# Patient Record
Sex: Male | Born: 1965 | Race: White | Hispanic: No | Marital: Married
Health system: Southern US, Community
[De-identification: ages and names within clinical notes are randomized; demographics above are authoritative.]

## PROBLEM LIST (undated history)

## (undated) HISTORY — PX: OTHER SURGICAL HISTORY: SHX169

---

## 1998-07-26 ENCOUNTER — Ambulatory Visit (HOSPITAL_COMMUNITY): Admission: RE | Admit: 1998-07-26 | Discharge: 1998-07-26 | Payer: Self-pay | Admitting: *Deleted

## 1998-10-24 ENCOUNTER — Encounter: Payer: Self-pay | Admitting: Emergency Medicine

## 1998-10-24 ENCOUNTER — Emergency Department (HOSPITAL_COMMUNITY): Admission: EM | Admit: 1998-10-24 | Discharge: 1998-10-24 | Payer: Self-pay | Admitting: Emergency Medicine

## 1998-10-25 ENCOUNTER — Emergency Department (HOSPITAL_COMMUNITY): Admission: EM | Admit: 1998-10-25 | Discharge: 1998-10-25 | Payer: Self-pay

## 1998-10-25 ENCOUNTER — Ambulatory Visit (HOSPITAL_BASED_OUTPATIENT_CLINIC_OR_DEPARTMENT_OTHER): Admission: RE | Admit: 1998-10-25 | Discharge: 1998-10-25 | Payer: Self-pay | Admitting: Orthopedic Surgery

## 2006-05-29 ENCOUNTER — Emergency Department (HOSPITAL_COMMUNITY): Admission: EM | Admit: 2006-05-29 | Discharge: 2006-05-29 | Payer: Self-pay | Admitting: Emergency Medicine

## 2007-03-14 ENCOUNTER — Emergency Department (HOSPITAL_COMMUNITY): Admission: EM | Admit: 2007-03-14 | Discharge: 2007-03-14 | Payer: Self-pay | Admitting: Emergency Medicine

## 2007-06-23 ENCOUNTER — Emergency Department (HOSPITAL_COMMUNITY): Admission: EM | Admit: 2007-06-23 | Discharge: 2007-06-23 | Payer: Self-pay | Admitting: *Deleted

## 2007-08-05 ENCOUNTER — Ambulatory Visit (HOSPITAL_COMMUNITY): Admission: RE | Admit: 2007-08-05 | Discharge: 2007-08-05 | Payer: Self-pay | Admitting: Chiropractic Medicine

## 2008-06-13 ENCOUNTER — Emergency Department (HOSPITAL_COMMUNITY): Admission: EM | Admit: 2008-06-13 | Discharge: 2008-06-13 | Payer: Self-pay | Admitting: Emergency Medicine

## 2008-10-11 IMAGING — CT CT PELVIS W/O CM
1 of 2 series · 15 of 32 positions shown, 19 images · non-contrast
Comparison: None

CLINICAL DATA: Right flank pain

ABDOMEN CT WITHOUT CONTRAST
TECHNIQUE: Multidetector CT imaging of the abdomen was performed following the
standard protocol without IV contrast.
TECHNIQUE: Multidetector CT imaging of the pelvis was performed following the

[Series 2: stone 5.0 b40f · axial · 0.73mm/px · z∈[-468,-28]mm · 15 of 96 slices shown, 19 images]
[im 4/96  soft-tissue]
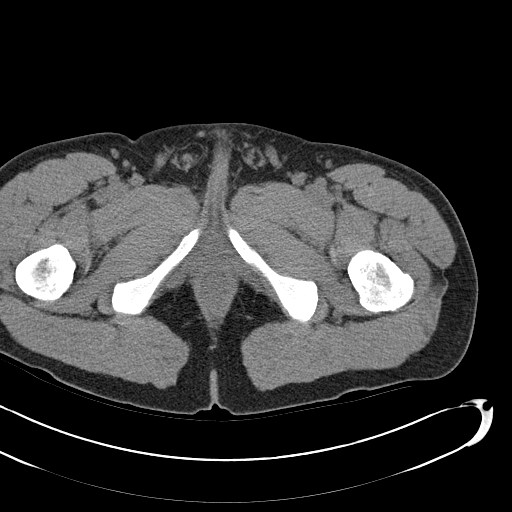
[im 4/96  bone]
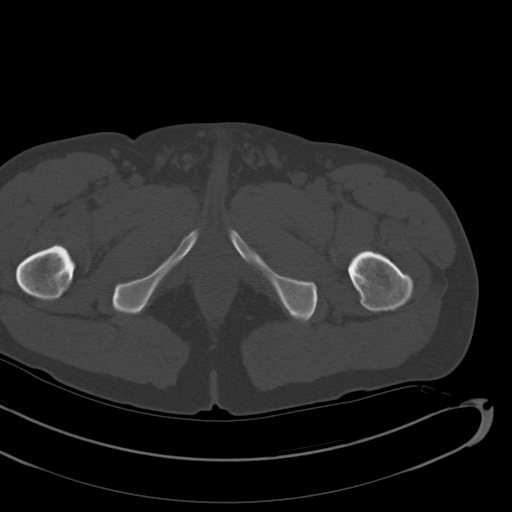
[im 12/96  soft-tissue]
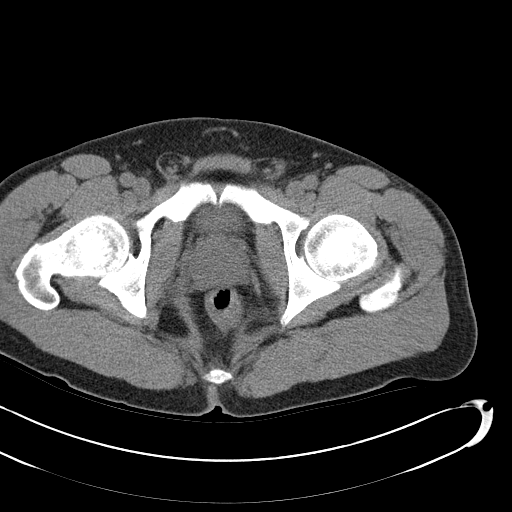
[im 20/96  soft-tissue]
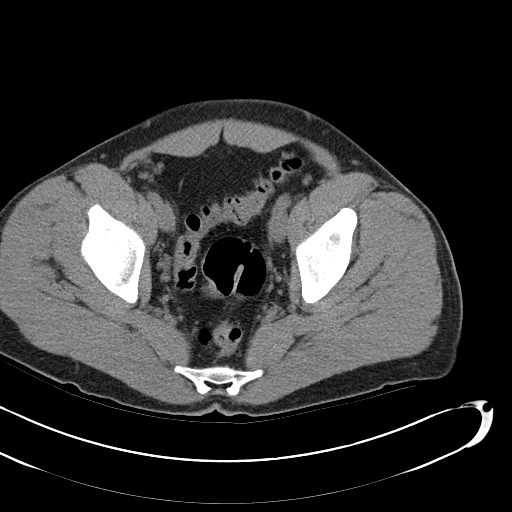
[im 27/96  soft-tissue]
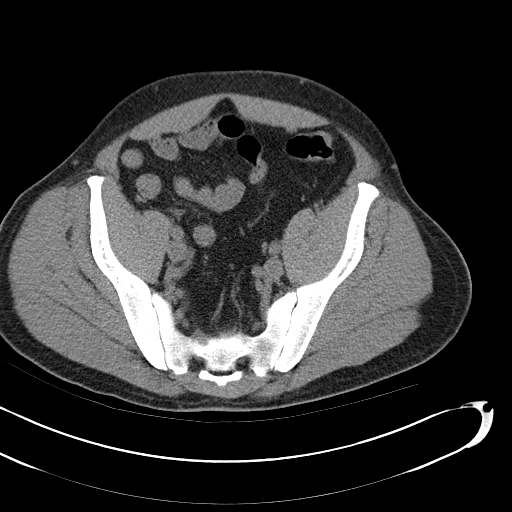
[im 35/96  soft-tissue]
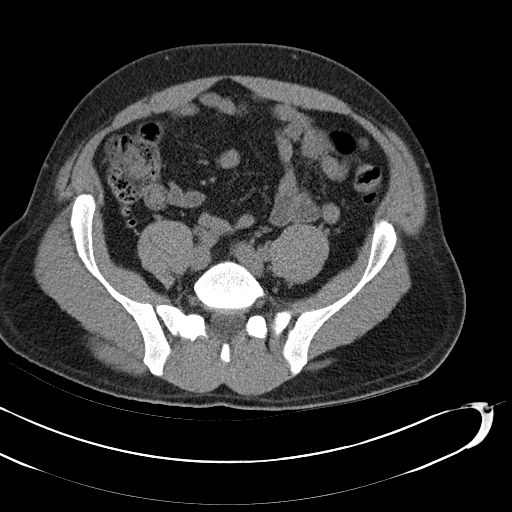
[im 42/96  soft-tissue]
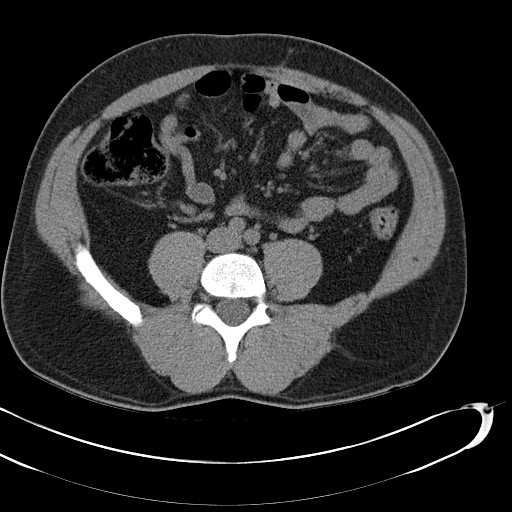
[im 50/96  soft-tissue]
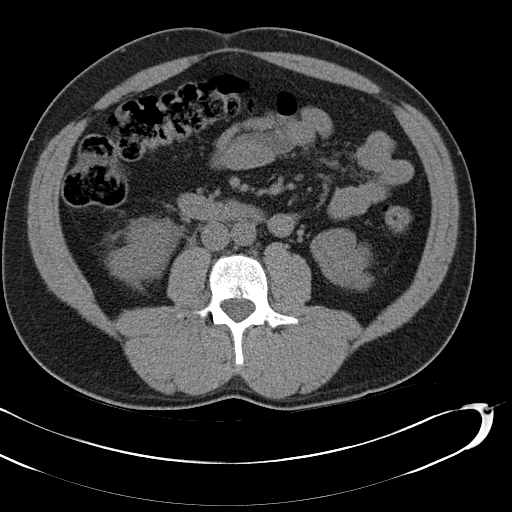
[im 54/96  soft-tissue]
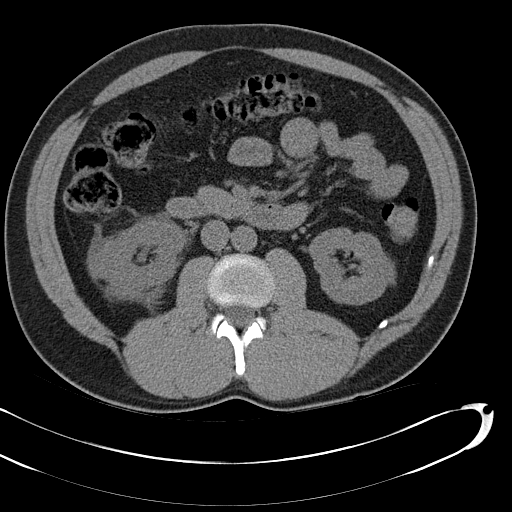
[im 61/96  soft-tissue]
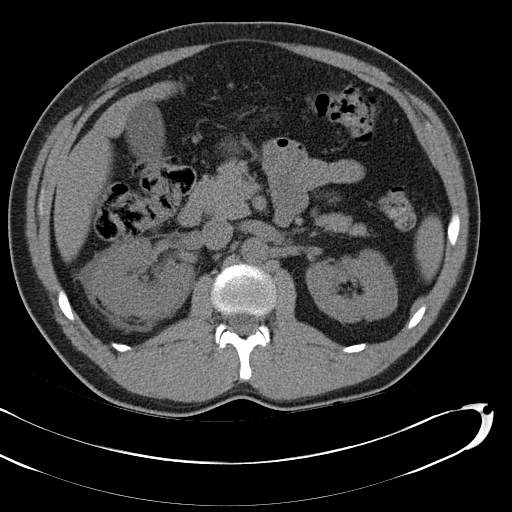
[im 61/96  bone]
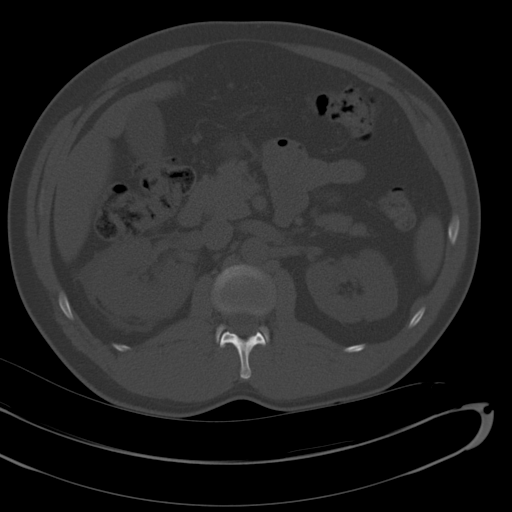
[im 69/96  soft-tissue]
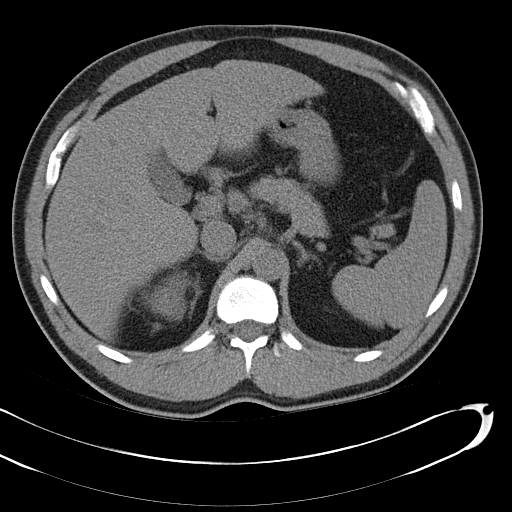
[im 77/96  soft-tissue]
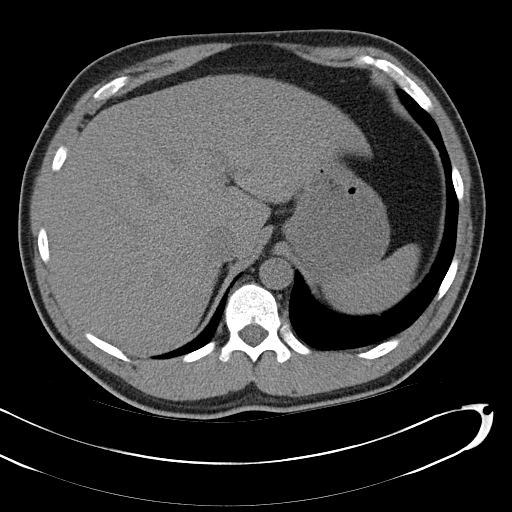
[im 80/96  lung]
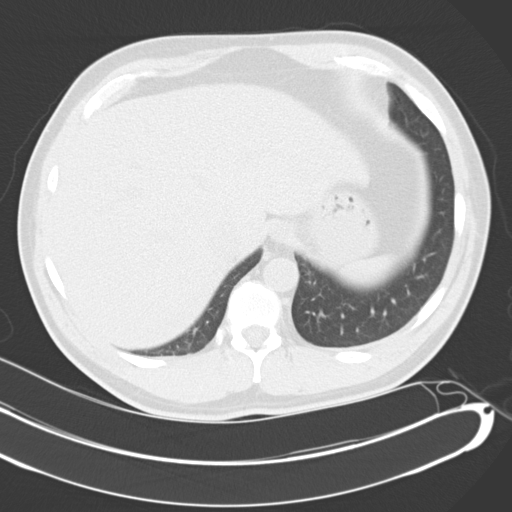
[im 84/96  soft-tissue]
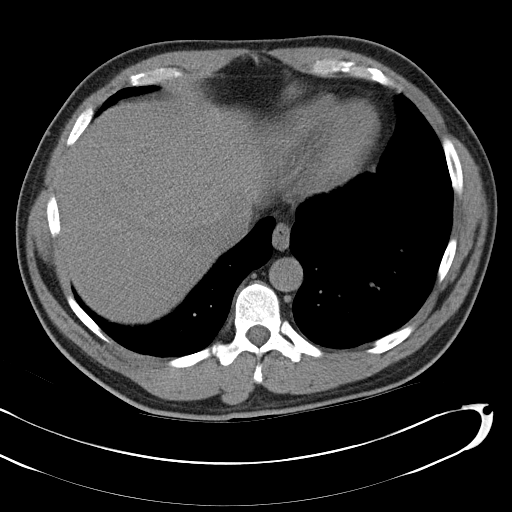
[im 84/96  lung]
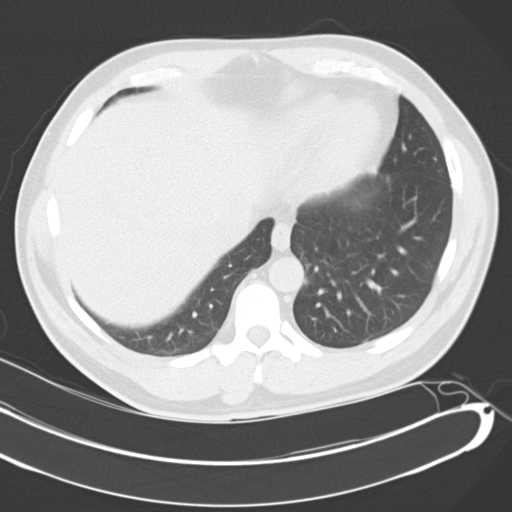
[im 88/96  lung]
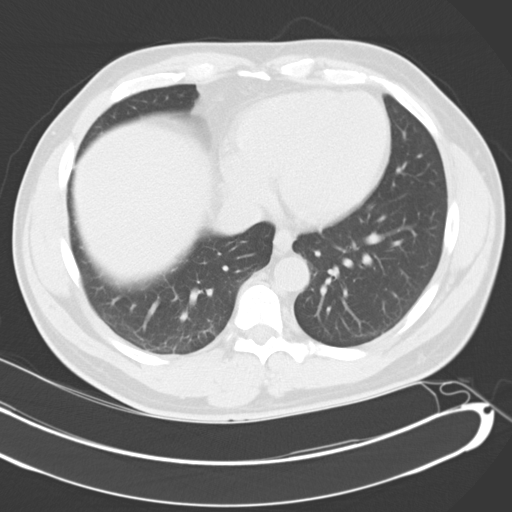
[im 92/96  soft-tissue]
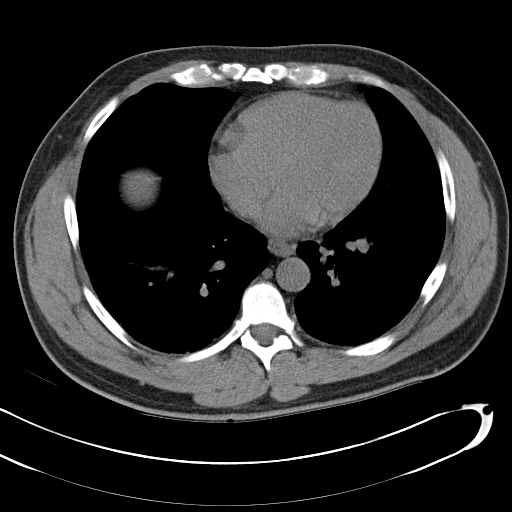
[im 92/96  lung]
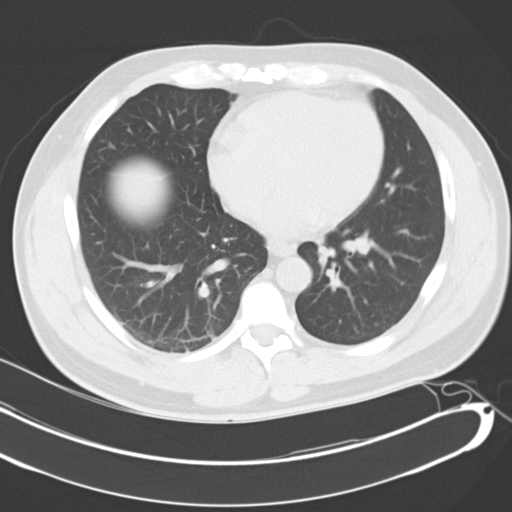

[15 of 32 positions shown; findings below may reference images not displayed]

FINDINGS: The noncontrast CT appearance of the liver, spleen, pancreas, adrenal
glands, and gallbladder is unremarkable.

Prominent right perirenal stranding is present along with mild right
hydronephrosis and mild right hydroureter extending down to a 3 mm right
ureterovesical junction calculus. There is a 2 mm nonobstructive right kidney
lower pole calculus.

IMPRESSION

1. Prominent right perirenal stranding with mild right hydroureter and
hydronephrosis associated with a 3 mm right ureterovesical junction calculus.
2. Nonobstructive right kidney lower pole calculus.

PELVIS CT WITHOUT CONTRAST
FINDINGS: Mild right hydroureter extends down to a 3 mm right ureterovesical
junction calculus. The appendix appears normal. No free pelvic fluid. Urinary
bladder appears otherwise unremarkable. The visualized pelvic bowel appears
normal.

IMPRESSION

1. 3 mm right ureterovesical junction calculus.

## 2010-07-09 ENCOUNTER — Encounter: Payer: Self-pay | Admitting: Chiropractic Medicine

## 2011-03-29 LAB — URINALYSIS, ROUTINE W REFLEX MICROSCOPIC
Bilirubin Urine: NEGATIVE
Ketones, ur: NEGATIVE
Nitrite: NEGATIVE
Urobilinogen, UA: 0.2

## 2015-09-23 ENCOUNTER — Ambulatory Visit
Admission: RE | Admit: 2015-09-23 | Discharge: 2015-09-23 | Disposition: A | Discharge status: Home / Self Care | Payer: No Typology Code available for payment source | Source: Ambulatory Visit | Attending: Occupational Medicine | Admitting: Occupational Medicine

## 2015-09-23 ENCOUNTER — Other Ambulatory Visit: Payer: Self-pay | Admitting: Occupational Medicine

## 2015-09-23 DIAGNOSIS — Z0000 Encounter for general adult medical examination without abnormal findings: Secondary | ICD-10-CM

## 2019-07-27 ENCOUNTER — Ambulatory Visit: Payer: No Typology Code available for payment source | Attending: Internal Medicine

## 2019-07-27 DIAGNOSIS — Z20822 Contact with and (suspected) exposure to covid-19: Secondary | ICD-10-CM | POA: Insufficient documentation

## 2019-07-28 LAB — NOVEL CORONAVIRUS, NAA: SARS-CoV-2, NAA: NOT DETECTED

## 2020-01-12 DIAGNOSIS — K429 Umbilical hernia without obstruction or gangrene: Secondary | ICD-10-CM | POA: Insufficient documentation

## 2020-04-08 ENCOUNTER — Other Ambulatory Visit: Payer: No Typology Code available for payment source

## 2020-04-08 ENCOUNTER — Other Ambulatory Visit: Payer: Self-pay

## 2020-04-08 DIAGNOSIS — Z20822 Contact with and (suspected) exposure to covid-19: Secondary | ICD-10-CM

## 2020-04-09 LAB — SARS-COV-2, NAA 2 DAY TAT

## 2020-04-09 LAB — NOVEL CORONAVIRUS, NAA: SARS-CoV-2, NAA: NOT DETECTED

## 2020-05-05 ENCOUNTER — Other Ambulatory Visit: Payer: No Typology Code available for payment source

## 2021-07-18 DIAGNOSIS — M170 Bilateral primary osteoarthritis of knee: Secondary | ICD-10-CM | POA: Insufficient documentation

## 2021-07-18 DIAGNOSIS — K219 Gastro-esophageal reflux disease without esophagitis: Secondary | ICD-10-CM | POA: Insufficient documentation

## 2021-07-19 DIAGNOSIS — E559 Vitamin D deficiency, unspecified: Secondary | ICD-10-CM | POA: Insufficient documentation

## 2021-12-26 DIAGNOSIS — F1120 Opioid dependence, uncomplicated: Secondary | ICD-10-CM | POA: Insufficient documentation

## 2022-04-09 ENCOUNTER — Emergency Department (HOSPITAL_BASED_OUTPATIENT_CLINIC_OR_DEPARTMENT_OTHER)
Admission: EM | Admit: 2022-04-09 | Discharge: 2022-04-09 | Disposition: A | Discharge status: Home / Self Care | Payer: 59 | Attending: Emergency Medicine | Admitting: Emergency Medicine

## 2022-04-09 ENCOUNTER — Emergency Department (HOSPITAL_BASED_OUTPATIENT_CLINIC_OR_DEPARTMENT_OTHER): Payer: 59

## 2022-04-09 ENCOUNTER — Other Ambulatory Visit: Payer: Self-pay

## 2022-04-09 DIAGNOSIS — X58XXXA Exposure to other specified factors, initial encounter: Secondary | ICD-10-CM | POA: Insufficient documentation

## 2022-04-09 DIAGNOSIS — S59911A Unspecified injury of right forearm, initial encounter: Secondary | ICD-10-CM | POA: Diagnosis present

## 2022-04-09 DIAGNOSIS — S52021A Displaced fracture of olecranon process without intraarticular extension of right ulna, initial encounter for closed fracture: Secondary | ICD-10-CM

## 2022-04-09 DIAGNOSIS — S46391A Other injury of muscle, fascia and tendon of triceps, right arm, initial encounter: Secondary | ICD-10-CM | POA: Insufficient documentation

## 2022-04-09 DIAGNOSIS — M25521 Pain in right elbow: Secondary | ICD-10-CM | POA: Insufficient documentation

## 2022-04-09 DIAGNOSIS — S46311A Strain of muscle, fascia and tendon of triceps, right arm, initial encounter: Secondary | ICD-10-CM

## 2022-04-09 DIAGNOSIS — S52024A Nondisplaced fracture of olecranon process without intraarticular extension of right ulna, initial encounter for closed fracture: Secondary | ICD-10-CM | POA: Insufficient documentation

## 2022-04-09 MED ORDER — OXYCODONE-ACETAMINOPHEN 5-325 MG PO TABS: 1.0000 | ORAL_TABLET | Freq: Four times a day (QID) | ORAL | 0 refills | Status: DC | PRN

## 2022-04-09 MED ORDER — OXYCODONE-ACETAMINOPHEN 5-325 MG PO TABS
1.0000 | ORAL_TABLET | Freq: Once | ORAL | Status: AC
  Administered 2022-04-09: 1 via ORAL
  Filled 2022-04-09: qty 1

## 2022-04-09 NOTE — ED Triage Notes (Signed)
Pt arrived POV. Pt caox4 and ambulatory. Pt reports yesterday around 5p he was pushing a riding lawn mower and felt a pop in his R elbow and has had increased pain and swelling since. Denies any other injury or complaints.

## 2022-04-09 NOTE — ED Provider Notes (Signed)
[facility] EMERGENCY DEPT Provider Note   CSN: [id number] Arrival date & time: 04/09/22  1006     History  Chief Complaint  Patient presents with   Arm Injury    Lee Lopez is a 56 y.o. male who presents to the emergency department with concerns for right arm injury onset yesterday.  Patient notes that he was pushing a riding lawn more prior to feeling a pop to his right elbow.  Notes that he has had increased pain and swelling since.  No meds tried at home.  Denies allergies to medications.  Denies right shoulder pain, wrist pain, hand pain.  The history is provided by the patient. No language interpreter was used.       Home Medications Prior to Admission medications   Medication Sig Start Date End Date Taking? Authorizing Provider  oxyCODONE-acetaminophen (PERCOCET/ROXICET) 5-325 MG tablet Take 1 tablet by mouth every 6 (six) hours as needed for severe pain. 04/09/22  Yes Laken Rog A, PA-C      Allergies    Patient has no known allergies.    Review of Systems   Review of Systems  Musculoskeletal:  Positive for arthralgias and joint swelling.  Skin:  Negative for color change and wound.  All other systems reviewed and are negative.   Physical Exam Updated Vital Signs BP (!) 139/105 (BP Location: Left Arm)   Pulse 94   Temp 98.4 F (36.9 C) (Oral)   Resp 16   SpO2 95%  Physical Exam Vitals and nursing note reviewed.  Constitutional:      General: He is not in acute distress.    Appearance: Normal appearance.  Eyes:     General: No scleral icterus.    Extraocular Movements: Extraocular movements intact.  Cardiovascular:     Rate and Rhythm: Normal rate.  Pulmonary:     Effort: Pulmonary effort is normal. No respiratory distress.  Musculoskeletal:     Cervical back: Neck supple.     Comments: Tenderness to palpation to right olecranon with swelling noted to the area.  Mild tenderness to palpation noted to right proximal forearm.   Decreased flexion of right elbow.  No difficulty with supination or pronation.  No tenderness to palpation noted to right shoulder, right proximal humerus, right distal forearm, right wrist, right hand.  Full range of motion of right shoulder, elbow, hand.  Skin:    General: Skin is warm and dry.     Findings: No bruising, erythema or rash.  Neurological:     Mental Status: He is alert.  Psychiatric:        Behavior: Behavior normal.     ED Results / Procedures / Treatments   Labs (all labs ordered are listed, but only abnormal results are displayed) Labs Reviewed - No data to display  EKG None  Radiology DG Elbow Complete Right  Result Date: 04/09/2022 CLINICAL DATA:  Recent injury with pop, swelling and pain. EXAM: RIGHT ELBOW - COMPLETE 3+ VIEW COMPARISON:  None Available. FINDINGS: Probable triceps tendon rupture/avulsion. Small calcified densities retracted proximally from the olecranon region. No evidence of humeral or radial fracture. IMPRESSION: Probable triceps tendon rupture/avulsion. Small calcified densities retracted proximally from the olecranon. No evidence of humeral or radial fracture. Electronically Signed   By: Nelson Chimes M.D.   On: 04/09/2022 10:46    Procedures .Splint Application  Date/Time: 04/09/2022 3:25 PM  Performed by: Nehemiah Settle, PA-C Authorized by: Nehemiah Settle, PA-C   Consent:  Consent obtained:  Verbal   Consent given by:  Patient   Risks, benefits, and alternatives were discussed: yes     Risks discussed:  Discoloration, numbness and pain   Alternatives discussed:  No treatment Universal protocol:    Patient identity confirmed:  Verbally with patient and hospital-assigned identification number Pre-procedure details:    Distal neurologic exam:  Normal   Distal perfusion: distal pulses strong and brisk capillary refill   Procedure details:    Location:  Elbow   Elbow location:  R elbow   Strapping: no     Splint type:  Long  arm   Supplies:  Sling, plaster and cotton padding Post-procedure details:    Distal neurologic exam:  Normal   Distal perfusion: distal pulses strong and brisk capillary refill     Procedure completion:  Tolerated well, no immediate complications     Medications Ordered in ED Medications  oxyCODONE-acetaminophen (PERCOCET/ROXICET) 5-325 MG per tablet 1 tablet (1 tablet Oral Given 04/09/22 1040)    ED Course/ Medical Decision Making/ A&P Clinical Course as of 04/09/22 1523  Mon Apr 09, 2022  1229 Consult with Orthopedist, Dr. Linna Caprice who recommends posterior long arm splint, sling, and follow up in office this week with hand surgeon.  [SB]  1231 Discussed with patient discharge treatment plan. Answered all available questions. Pt appears safe for discharge at this time.  [SB]    Clinical Course User Index [SB] Cedricka Sackrider A, PA-C                           Medical Decision Making Amount and/or Complexity of Data Reviewed Radiology: ordered.  Risk Prescription drug management.   Patient with right elbow pain onset yesterday status post pushing a riding lawn more.  No meds tried prior to arrival.  Patient afebrile.  On exam patient with Tenderness to palpation to right olecranon with swelling noted to the area.  Mild tenderness to palpation noted to right proximal forearm.  Decreased flexion of right elbow.  No difficulty with supination or pronation.  No tenderness to palpation noted to right shoulder, right proximal humerus, right distal forearm, right wrist, right hand.  Full range of motion of right shoulder, elbow, hand.  Differential diagnosis includes fracture, dislocation, sprain.     Imaging: I ordered imaging studies including right elbow xray I independently visualized and interpreted imaging which showed:  Probable triceps tendon rupture/avulsion. Small calcified densities  retracted proximally from the olecranon. No evidence of humeral or  radial fracture.   I  agree with the radiologist interpretation  Medications:  I ordered medication including percocet and ice for pain management Reevaluation of the patient after these medicines and interventions, I reevaluated the patient and found that they have improved I have reviewed the patients home medicines and have made adjustments as needed   Consultations: I requested consultation with the Orthopedist, Dr. Linna Caprice and discussed lab and imaging findings as well as pertinent plan - they recommend: posterior long arm splint, sling, and follow up in office with hand surgeon this week.    Disposition: Presentation suspicious for tendon rupture with avulsion of right olecranon. Doubt dislocation at this time. Doubt sprain. After consideration of the diagnostic results and the patients response to treatment, I feel that the patient would benefit from Discharge home.  Pt splinted in the ED and provided with sling. Information for hand surgeon provided today. PDMP reviewed, pt sent a short course of  percocet. Supportive care measures and strict return precautions discussed with patient at bedside. Pt acknowledges and verbalizes understanding. Pt appears safe for discharge. Follow up as indicated in discharge paperwork.    This chart was dictated using voice recognition software, Dragon. Despite the best efforts of this provider to proofread and correct errors, errors may still occur which can change documentation meaning.   Final Clinical Impression(s) / ED Diagnoses Final diagnoses:  Rupture of right triceps tendon, initial encounter  Closed fracture of olecranon process of right ulna, initial encounter    Rx / DC Orders ED Discharge Orders          Ordered    oxyCODONE-acetaminophen (PERCOCET/ROXICET) 5-325 MG tablet  Every 6 hours PRN        04/09/22 1244              Yazid Pop A, PA-C 04/09/22 1526    Rondel Baton, MD 04/10/22 2128

## 2022-04-09 NOTE — Discharge Instructions (Signed)
It was a pleasure taking care of you today!   Your xray today showed concern for fracture (break) to your right elbow and possible rupture of your triceps tendon. You will be placed in a splint and sling today. You will be sent a short course of percocet, take as prescribed. You may alternate with 600 ibuprofen every 6 hours. It is important that you follow up with the hand specialist (info attached) regarding today's ED visit for a follow up appointment this week. Return to the ED if you are experiencing increasing/worsening symptoms.

## 2022-04-09 NOTE — ED Notes (Signed)
Patient verbalized understanding of reasons to return to the ED.  He is to follow up with ortho.

## 2023-06-24 DIAGNOSIS — R7989 Other specified abnormal findings of blood chemistry: Secondary | ICD-10-CM | POA: Insufficient documentation

## 2023-09-03 ENCOUNTER — Other Ambulatory Visit: Payer: Self-pay

## 2023-09-03 ENCOUNTER — Encounter (HOSPITAL_BASED_OUTPATIENT_CLINIC_OR_DEPARTMENT_OTHER): Payer: Self-pay

## 2023-09-03 ENCOUNTER — Emergency Department (HOSPITAL_BASED_OUTPATIENT_CLINIC_OR_DEPARTMENT_OTHER)

## 2023-09-03 ENCOUNTER — Inpatient Hospital Stay (HOSPITAL_BASED_OUTPATIENT_CLINIC_OR_DEPARTMENT_OTHER)
Admission: EM | Admit: 2023-09-03 | Discharge: 2023-09-05 | DRG: 394 | Disposition: A | Discharge status: Home / Self Care | Attending: Student | Admitting: Student

## 2023-09-03 DIAGNOSIS — R109 Unspecified abdominal pain: Secondary | ICD-10-CM | POA: Insufficient documentation

## 2023-09-03 DIAGNOSIS — M170 Bilateral primary osteoarthritis of knee: Secondary | ICD-10-CM | POA: Diagnosis present

## 2023-09-03 DIAGNOSIS — M25562 Pain in left knee: Secondary | ICD-10-CM | POA: Diagnosis not present

## 2023-09-03 DIAGNOSIS — A09 Infectious gastroenteritis and colitis, unspecified: Secondary | ICD-10-CM | POA: Diagnosis present

## 2023-09-03 DIAGNOSIS — E291 Testicular hypofunction: Secondary | ICD-10-CM | POA: Diagnosis present

## 2023-09-03 DIAGNOSIS — Z789 Other specified health status: Secondary | ICD-10-CM | POA: Diagnosis not present

## 2023-09-03 DIAGNOSIS — F1110 Opioid abuse, uncomplicated: Secondary | ICD-10-CM | POA: Diagnosis present

## 2023-09-03 DIAGNOSIS — M25561 Pain in right knee: Secondary | ICD-10-CM | POA: Diagnosis not present

## 2023-09-03 DIAGNOSIS — K529 Noninfective gastroenteritis and colitis, unspecified: Secondary | ICD-10-CM | POA: Diagnosis present

## 2023-09-03 DIAGNOSIS — K219 Gastro-esophageal reflux disease without esophagitis: Secondary | ICD-10-CM | POA: Diagnosis present

## 2023-09-03 DIAGNOSIS — F1490 Cocaine use, unspecified, uncomplicated: Secondary | ICD-10-CM | POA: Diagnosis present

## 2023-09-03 DIAGNOSIS — R7982 Elevated C-reactive protein (CRP): Secondary | ICD-10-CM | POA: Diagnosis present

## 2023-09-03 DIAGNOSIS — F1010 Alcohol abuse, uncomplicated: Secondary | ICD-10-CM | POA: Diagnosis present

## 2023-09-03 DIAGNOSIS — K559 Vascular disorder of intestine, unspecified: Principal | ICD-10-CM | POA: Diagnosis present

## 2023-09-03 DIAGNOSIS — M2550 Pain in unspecified joint: Secondary | ICD-10-CM | POA: Insufficient documentation

## 2023-09-03 DIAGNOSIS — K56609 Unspecified intestinal obstruction, unspecified as to partial versus complete obstruction: Principal | ICD-10-CM

## 2023-09-03 LAB — CBC
HCT: 46.1 % (ref 39.0–52.0)
Hemoglobin: 14.8 g/dL (ref 13.0–17.0)
MCH: 27.8 pg (ref 26.0–34.0)
MCHC: 32.1 g/dL (ref 30.0–36.0)
MCV: 86.5 fL (ref 80.0–100.0)
Platelets: 393 10*3/uL (ref 150–400)
RBC: 5.33 MIL/uL (ref 4.22–5.81)
RDW: 13.9 % (ref 11.5–15.5)
WBC: 11.6 10*3/uL — ABNORMAL HIGH (ref 4.0–10.5)
nRBC: 0 % (ref 0.0–0.2)

## 2023-09-03 LAB — COMPREHENSIVE METABOLIC PANEL
ALT: 10 U/L (ref 0–44)
AST: 15 U/L (ref 15–41)
Albumin: 4 g/dL (ref 3.5–5.0)
Alkaline Phosphatase: 57 U/L (ref 38–126)
Anion gap: 6 (ref 5–15)
BUN: 10 mg/dL (ref 6–20)
CO2: 28 mmol/L (ref 22–32)
Calcium: 8.2 mg/dL — ABNORMAL LOW (ref 8.9–10.3)
Chloride: 102 mmol/L (ref 98–111)
Creatinine, Ser: 0.98 mg/dL (ref 0.61–1.24)
GFR, Estimated: >60 mL/min (ref >60)
Glucose, Bld: 99 mg/dL (ref 70–99)
Potassium: 3.9 mmol/L (ref 3.5–5.1)
Sodium: 136 mmol/L (ref 135–145)
Total Bilirubin: 0.4 mg/dL (ref 0.0–1.2)
Total Protein: 6.9 g/dL (ref 6.5–8.1)

## 2023-09-03 LAB — URINALYSIS, ROUTINE W REFLEX MICROSCOPIC
Bilirubin Urine: NEGATIVE
Glucose, UA: NEGATIVE mg/dL
Hgb urine dipstick: NEGATIVE
Ketones, ur: >80 mg/dL — AB
Leukocytes,Ua: NEGATIVE
Nitrite: NEGATIVE
Specific Gravity, Urine: >1.046 — ABNORMAL HIGH (ref 1.005–1.030)
pH: 6 (ref 5.0–8.0)

## 2023-09-03 LAB — LIPASE, BLOOD: Lipase: 23 U/L (ref 11–51)

## 2023-09-03 MED ORDER — PANTOPRAZOLE SODIUM 40 MG IV SOLR
40.0000 mg | Freq: Once | INTRAVENOUS | Status: AC
  Administered 2023-09-03: 40 mg via INTRAVENOUS
  Filled 2023-09-03: qty 10

## 2023-09-03 MED ORDER — SODIUM CHLORIDE 0.9 % IV BOLUS
1000.0000 mL | Freq: Once | INTRAVENOUS | Status: AC
  Administered 2023-09-03: 1000 mL via INTRAVENOUS

## 2023-09-03 MED ORDER — IOHEXOL 300 MG/ML  SOLN
100.0000 mL | Freq: Once | INTRAMUSCULAR | Status: AC | PRN
  Administered 2023-09-03: 100 mL via INTRAVENOUS

## 2023-09-03 MED ORDER — MORPHINE SULFATE (PF) 4 MG/ML IV SOLN
6.0000 mg | Freq: Once | INTRAVENOUS | Status: AC
  Administered 2023-09-03: 6 mg via INTRAVENOUS
  Filled 2023-09-03: qty 2

## 2023-09-03 MED ORDER — ONDANSETRON HCL 4 MG/2ML IJ SOLN
4.0000 mg | Freq: Once | INTRAMUSCULAR | Status: AC
  Administered 2023-09-03: 4 mg via INTRAVENOUS
  Filled 2023-09-03: qty 2

## 2023-09-03 NOTE — ED Provider Notes (Signed)
 [place] EMERGENCY DEPARTMENT AT [facility] Provider Note   CSN: [id number] Arrival date & time: 09/03/23  1512     History  Chief Complaint  Patient presents with   Abdominal Pain    Lee Lopez is a 58 y.o. male.  Patient is a 58 year old male who presents with abdominal pain.  He states it has been worsening over the last 2 to 3 days.  He describes pain in his upper abdomen.  He has associated nausea vomiting and loose stools.  He denies any blood in his stools or hematemesis.  No fevers.  No urinary symptoms.  He does drink about 5-6 beers a night.  His last alcohol intake was a few days ago.  He denies any withdrawal symptoms.  He denies any prior abdominal surgeries.       Home Medications Prior to Admission medications   Medication Sig Start Date End Date Taking? Authorizing Provider  oxyCODONE-acetaminophen (PERCOCET/ROXICET) 5-325 MG tablet Take 1 tablet by mouth every 6 (six) hours as needed for severe pain. 04/09/22   Blue, Soijett A, PA-C      Allergies    Patient has no known allergies.    Review of Systems   Review of Systems  Constitutional:  Negative for chills, diaphoresis, fatigue and fever.  HENT:  Negative for congestion, rhinorrhea and sneezing.   Eyes: Negative.   Respiratory:  Negative for cough, chest tightness and shortness of breath.   Cardiovascular:  Negative for chest pain and leg swelling.  Gastrointestinal:  Positive for abdominal pain, diarrhea, nausea and vomiting. Negative for blood in stool.  Genitourinary:  Negative for difficulty urinating, flank pain, frequency and hematuria.  Musculoskeletal:  Negative for arthralgias and back pain.  Skin:  Negative for rash.  Neurological:  Negative for dizziness, speech difficulty, weakness, numbness and headaches.    Physical Exam Updated Vital Signs BP (!) 134/97   Pulse 96   Temp 98 F (36.7 C)   Resp 20   SpO2 93%  Physical Exam Constitutional:      Appearance: He is  well-developed.  HENT:     Head: Normocephalic and atraumatic.  Eyes:     Pupils: Pupils are equal, round, and reactive to light.  Cardiovascular:     Rate and Rhythm: Normal rate and regular rhythm.     Heart sounds: Normal heart sounds.  Pulmonary:     Effort: Pulmonary effort is normal. No respiratory distress.     Breath sounds: Normal breath sounds. No wheezing or rales.  Chest:     Chest wall: No tenderness.  Abdominal:     General: Bowel sounds are normal.     Palpations: Abdomen is soft.     Tenderness: There is abdominal tenderness in the epigastric area. There is no guarding or rebound.  Musculoskeletal:        General: Normal range of motion.     Cervical back: Normal range of motion and neck supple.  Lymphadenopathy:     Cervical: No cervical adenopathy.  Skin:    General: Skin is warm and dry.     Findings: No rash.  Neurological:     Mental Status: He is alert and oriented to person, place, and time.     ED Results / Procedures / Treatments   Labs (all labs ordered are listed, but only abnormal results are displayed) Labs Reviewed  COMPREHENSIVE METABOLIC PANEL - Abnormal; Notable for the following components:      Result Value  Calcium 8.2 (*)    All other components within normal limits  CBC - Abnormal; Notable for the following components:   WBC 11.6 (*)    All other components within normal limits  URINALYSIS, ROUTINE W REFLEX MICROSCOPIC - Abnormal; Notable for the following components:   Specific Gravity, Urine >1.046 (*)    Ketones, ur >80 (*)    Protein, ur TRACE (*)    All other components within normal limits  LIPASE, BLOOD    EKG None  Radiology CT ABDOMEN PELVIS W CONTRAST Result Date: 09/03/2023 CLINICAL DATA:  Abdominal pain EXAM: CT ABDOMEN AND PELVIS WITH CONTRAST TECHNIQUE: Multidetector CT imaging of the abdomen and pelvis was performed using the standard protocol following bolus administration of intravenous contrast. RADIATION  DOSE REDUCTION: This exam was performed according to the departmental dose-optimization program which includes automated exposure control, adjustment of the mA and/or kV according to patient size and/or use of iterative reconstruction technique. CONTRAST:  OMNIPAQUE IOHEXOL 300 MG/ML  SOLN COMPARISON:  None Available. FINDINGS: Lower chest: No acute abnormality Hepatobiliary: No focal hepatic abnormality. Gallbladder unremarkable. Pancreas: No focal abnormality or ductal dilatation. Spleen: No focal abnormality.  Normal size. Adrenals/Urinary Tract: No adrenal abnormality. No focal renal abnormality. No stones or hydronephrosis. Urinary bladder is unremarkable. Stomach/Bowel: Sigmoid diverticulosis. Several abnormally thickened distal small bowel loops with surrounding inflammation compatible with pectus or inflammatory enteritis. Small bowel proximal to these distal small bowel loops are mildly dilated compatible with some degree of obstruction. Stomach is decompressed. Vascular/Lymphatic: No evidence of aneurysm or adenopathy. Reproductive: No visible focal abnormality. Other: Trace free fluid in the pelvis.  No free air. Musculoskeletal: No acute bony abnormality. IMPRESSION: Abnormally thickened and inflamed distal small bowel loops compatible with infectious or inflammatory enteritis. Mild associated small bowel obstruction. Electronically Signed   By: Charlett Nose M.D.   On: 09/03/2023 21:52    Procedures Procedures    Medications Ordered in ED Medications  pantoprazole (PROTONIX) injection 40 mg (40 mg Intravenous Given 09/03/23 2054)  morphine (PF) 4 MG/ML injection 6 mg (6 mg Intravenous Given 09/03/23 2052)  ondansetron (ZOFRAN) injection 4 mg (4 mg Intravenous Given 09/03/23 2050)  sodium chloride 0.9 % bolus 1,000 mL (0 mLs Intravenous Stopped 09/03/23 2205)  iohexol (OMNIPAQUE) 300 MG/ML solution 100 mL (100 mLs Intravenous Contrast Given 09/03/23 2039)  morphine (PF) 4 MG/ML injection 6  mg (6 mg Intravenous Given 09/03/23 2205)    ED Course/ Medical Decision Making/ A&P                                 Medical Decision Making Amount and/or Complexity of Data Reviewed Labs: ordered. Radiology: ordered.  Risk Prescription drug management. Decision regarding hospitalization.   Patient presents with abdominal pain associated with vomiting and diarrhea.  He is afebrile.  His labs are nonconcerning.  CT scan shows evidence of enteritis with a mild small bowel obstruction.  NG tube was not placed as patient does not have any ongoing vomiting or significant bowel distention.  I discussed with Dr. Dossie Der with general surgery.  Discussed with Dr. Earlyne Iba who will admit the patient for further treatment.  Final Clinical Impression(s) / ED Diagnoses Final diagnoses:  SBO (small bowel obstruction) (HCC)  Enteritis    Rx / DC Orders ED Discharge Orders     None         Rolan Bucco, MD 09/03/23 2333

## 2023-09-03 NOTE — Plan of Care (Signed)
 Plan of Care Note for accepted transfer   Patient name: Lee Lopez ONG:[id number] DOB: 08-09-1965  Facility requesting transfer: [facility] ED Requesting Provider: Dr. Fredderick Phenix Facility course: 58 year old male with history of alcohol use disorder presented to the ED with abdominal pain x 2-3 days associated with nausea, vomiting, and diarrhea.  WBC count 11.6, afebrile.  CT showing abnormally thickened and inflamed distal small bowel loops compatible with infectious or inflammatory enteritis and also showing mild associated small bowel obstruction.  Patient was given morphine, Zofran, IV Protonix, and 1 L IV fluids.  ED spoke to Dr. Dossie Der and general surgery will consult.  Plan of care: The patient is accepted for admission to Telemetry unit at [facility].  [facility] will assume care on arrival to accepting facility. Until arrival, care as per EDP. However, [facility] available 24/7 for questions and assistance.  Check [email or web address] for on-call coverage.  Nursing staff, please call [facility] Admits & Consults System-Wide number under Amion on patient's arrival so appropriate admitting provider can evaluate the pt.

## 2023-09-03 NOTE — Progress Notes (Signed)
 Patient arrived to 2W07 from [facility] via [facility]. [facility] Admits & Consults paged to notify of patient's arrival.

## 2023-09-03 NOTE — ED Triage Notes (Signed)
 C/o central ABD pain starting last night. Hx of ulcers.  Endorses n/v/d. Denies fevers.

## 2023-09-03 NOTE — ED Notes (Signed)
 Patient in CT

## 2023-09-03 NOTE — ED Notes (Signed)
 Patient transported to CT

## 2023-09-04 ENCOUNTER — Encounter (HOSPITAL_COMMUNITY): Payer: Self-pay | Admitting: Internal Medicine

## 2023-09-04 ENCOUNTER — Inpatient Hospital Stay (HOSPITAL_COMMUNITY)

## 2023-09-04 DIAGNOSIS — Z789 Other specified health status: Secondary | ICD-10-CM | POA: Insufficient documentation

## 2023-09-04 DIAGNOSIS — R109 Unspecified abdominal pain: Secondary | ICD-10-CM | POA: Insufficient documentation

## 2023-09-04 DIAGNOSIS — R1033 Periumbilical pain: Secondary | ICD-10-CM

## 2023-09-04 DIAGNOSIS — K529 Noninfective gastroenteritis and colitis, unspecified: Secondary | ICD-10-CM

## 2023-09-04 DIAGNOSIS — M2550 Pain in unspecified joint: Secondary | ICD-10-CM | POA: Insufficient documentation

## 2023-09-04 DIAGNOSIS — M25562 Pain in left knee: Secondary | ICD-10-CM

## 2023-09-04 DIAGNOSIS — M25561 Pain in right knee: Secondary | ICD-10-CM

## 2023-09-04 LAB — CBC WITH DIFFERENTIAL/PLATELET
Abs Immature Granulocytes: 0.03 10*3/uL (ref 0.00–0.07)
Basophils Absolute: 0.1 10*3/uL (ref 0.0–0.1)
Basophils Relative: 1 %
Eosinophils Absolute: 0.1 10*3/uL (ref 0.0–0.5)
Eosinophils Relative: 1 %
HCT: 43.5 % (ref 39.0–52.0)
Hemoglobin: 14 g/dL (ref 13.0–17.0)
Immature Granulocytes: 0 %
Lymphocytes Relative: 17 %
Lymphs Abs: 1.8 10*3/uL (ref 0.7–4.0)
MCH: 28.3 pg (ref 26.0–34.0)
MCHC: 32.2 g/dL (ref 30.0–36.0)
MCV: 87.9 fL (ref 80.0–100.0)
Monocytes Absolute: 1.5 10*3/uL — ABNORMAL HIGH (ref 0.1–1.0)
Monocytes Relative: 14 %
Neutro Abs: 7.2 10*3/uL (ref 1.7–7.7)
Neutrophils Relative %: 67 %
Platelets: 325 10*3/uL (ref 150–400)
RBC: 4.95 MIL/uL (ref 4.22–5.81)
RDW: 13.8 % (ref 11.5–15.5)
WBC: 10.6 10*3/uL — ABNORMAL HIGH (ref 4.0–10.5)
nRBC: 0 % (ref 0.0–0.2)

## 2023-09-04 LAB — COMPREHENSIVE METABOLIC PANEL
ALT: 11 U/L (ref 0–44)
AST: 16 U/L (ref 15–41)
Albumin: 2.6 g/dL — ABNORMAL LOW (ref 3.5–5.0)
Alkaline Phosphatase: 51 U/L (ref 38–126)
Anion gap: 4 — ABNORMAL LOW (ref 5–15)
BUN: 9 mg/dL (ref 6–20)
CO2: 27 mmol/L (ref 22–32)
Calcium: 7.3 mg/dL — ABNORMAL LOW (ref 8.9–10.3)
Chloride: 107 mmol/L (ref 98–111)
Creatinine, Ser: 1 mg/dL (ref 0.61–1.24)
GFR, Estimated: >60 mL/min (ref >60)
Glucose, Bld: 88 mg/dL (ref 70–99)
Potassium: 3.8 mmol/L (ref 3.5–5.1)
Sodium: 138 mmol/L (ref 135–145)
Total Bilirubin: 0.7 mg/dL (ref 0.0–1.2)
Total Protein: 5.5 g/dL — ABNORMAL LOW (ref 6.5–8.1)

## 2023-09-04 LAB — RAPID URINE DRUG SCREEN, HOSP PERFORMED
Amphetamines: NOT DETECTED
Barbiturates: NOT DETECTED
Benzodiazepines: POSITIVE — AB
Cocaine: POSITIVE — AB
Opiates: POSITIVE — AB
Tetrahydrocannabinol: NOT DETECTED

## 2023-09-04 LAB — C-REACTIVE PROTEIN: CRP: 10.8 mg/dL — ABNORMAL HIGH (ref <1.0)

## 2023-09-04 LAB — HIV ANTIBODY (ROUTINE TESTING W REFLEX): HIV Screen 4th Generation wRfx: NONREACTIVE

## 2023-09-04 LAB — SEDIMENTATION RATE: Sed Rate: 20 mm/h — ABNORMAL HIGH (ref 0–16)

## 2023-09-04 MED ORDER — PANTOPRAZOLE SODIUM 40 MG PO TBEC
40.0000 mg | DELAYED_RELEASE_TABLET | Freq: Every day | ORAL | Status: DC
  Administered 2023-09-04 – 2023-09-05 (×2): 40 mg via ORAL
  Filled 2023-09-04 (×2): qty 1

## 2023-09-04 MED ORDER — LACTATED RINGERS IV SOLN: INTRAVENOUS | Status: AC

## 2023-09-04 MED ORDER — LORAZEPAM 1 MG PO TABS
1.0000 mg | ORAL_TABLET | ORAL | Status: DC | PRN
  Administered 2023-09-04 – 2023-09-05 (×2): 1 mg via ORAL
  Filled 2023-09-04 (×2): qty 1

## 2023-09-04 MED ORDER — THIAMINE HCL 100 MG/ML IJ SOLN: 100.0000 mg | Freq: Every day | INTRAMUSCULAR | Status: DC

## 2023-09-04 MED ORDER — ONDANSETRON HCL 4 MG PO TABS: 4.0000 mg | ORAL_TABLET | Freq: Four times a day (QID) | ORAL | Status: DC | PRN

## 2023-09-04 MED ORDER — FOLIC ACID 1 MG PO TABS
1.0000 mg | ORAL_TABLET | Freq: Every day | ORAL | Status: DC
Start: 2023-09-04 — End: 2023-09-05
  Administered 2023-09-04 – 2023-09-05 (×2): 1 mg via ORAL
  Filled 2023-09-04 (×2): qty 1

## 2023-09-04 MED ORDER — ONDANSETRON HCL 4 MG/2ML IJ SOLN: 4.0000 mg | Freq: Four times a day (QID) | INTRAMUSCULAR | Status: DC | PRN

## 2023-09-04 MED ORDER — OXYCODONE HCL 5 MG PO TABS
5.0000 mg | ORAL_TABLET | Freq: Three times a day (TID) | ORAL | Status: DC | PRN
  Administered 2023-09-04 – 2023-09-05 (×2): 5 mg via ORAL
  Filled 2023-09-04 (×2): qty 1

## 2023-09-04 MED ORDER — LORAZEPAM 2 MG/ML IJ SOLN: 1.0000 mg | INTRAMUSCULAR | Status: DC | PRN

## 2023-09-04 MED ORDER — ADULT MULTIVITAMIN W/MINERALS CH
1.0000 | ORAL_TABLET | Freq: Every day | ORAL | Status: DC
  Administered 2023-09-04 – 2023-09-05 (×2): 1 via ORAL
  Filled 2023-09-04 (×2): qty 1

## 2023-09-04 MED ORDER — THIAMINE MONONITRATE 100 MG PO TABS
100.0000 mg | ORAL_TABLET | Freq: Every day | ORAL | Status: DC
  Administered 2023-09-04 – 2023-09-05 (×2): 100 mg via ORAL
  Filled 2023-09-04 (×2): qty 1

## 2023-09-04 MED ORDER — HYDROMORPHONE HCL 1 MG/ML IJ SOLN
0.5000 mg | INTRAMUSCULAR | Status: DC | PRN
  Administered 2023-09-04 (×3): 0.5 mg via INTRAVENOUS
  Filled 2023-09-04 (×3): qty 0.5

## 2023-09-04 MED ORDER — OXYCODONE HCL 5 MG PO TABS
5.0000 mg | ORAL_TABLET | Freq: Four times a day (QID) | ORAL | Status: DC | PRN
  Administered 2023-09-04: 5 mg via ORAL
  Filled 2023-09-04: qty 1

## 2023-09-04 NOTE — Plan of Care (Signed)
  Problem: Education: Goal: Knowledge of General Education information will improve Description: Including pain rating scale, medication(s)/side effects and non-pharmacologic comfort measures Outcome: Progressing   Problem: Pain Managment: Goal: General experience of comfort will improve and/or be controlled Outcome: Progressing

## 2023-09-04 NOTE — Consult Note (Cosign Needed Addendum)
 CASPER PAGLIUCA January 05, 1966  [id number].    Requesting MD: Alanda Slim, MD Chief Complaint/Reason for Consult: SBO vs enteritis   HPI:  Lee Lopez is a 58 y/o M who presents with abdominal pain and diarrhea. Reports Monday night, about 2-3 hours after eating sushi, he developed severe, stabbing, upper abdominal pain, followed by over 10 episodes of watery diarrhea. He denies associated fever, chills, vomiting, or blood in his stool. Denies similar episodes in the past. During my exam he reports ongoing episodes of severe, colicky pain that last for 30 seconds and then ease off, leaving his abdomen sore. Says his last BM was lunch-time yesterday. At baseline, for the last 2 years, he reports 4-5 watery, non-painful BMs daily. He denies a known personal or family history of crohn's/ulcerative colitis. States he had a colonoscopy about 10 years ago that was negative for polyps or any malignancy. Denies a history of abdominal surgery. Reports drinking 2-3 beers daily, more on weekends. Denies history of EtOH withdrawal. Reports taking percocet at one time for back pain but stopping taking it and denies IVDU. He is currently employed as a Emergency planning/management officer and says he has a stressful job.    ROS: Review of Systems  All other systems reviewed and are negative.   Family History  Problem Relation Age of Onset   Colon cancer Neg Hx     History reviewed. No pertinent past medical history.  Past Surgical History:  Procedure Laterality Date   arthroscopy      Social History:  reports that he has never smoked. He has never used smokeless tobacco. He reports current alcohol use of about 6.0 standard drinks of alcohol per week. He reports that he does not currently use drugs.  Allergies: No Known Allergies  Medications Prior to Admission  Medication Sig Dispense Refill   oxyCODONE-acetaminophen (PERCOCET/ROXICET) 5-325 MG tablet Take 1 tablet by mouth every 6 (six) hours as needed for severe  pain. 9 tablet 0     Physical Exam: Blood pressure 126/89, pulse 80, temperature 98.7 F (37.1 C), temperature source Oral, resp. rate 20, height 5\' 11"  (1.803 m), weight 83.9 kg, SpO2 94%. General: Pleasant white male  laying on hospital bed, appears stated age, NAD. HEENT: head -normocephalic, atraumatic; Eyes: PERRLA, no conjunctival injection Neck- Trachea is midline, no thyromegaly or JVD appreciated.  CV- RRR, normal S1/S2, no M/R/G, no lower extremity edema  Pulm- breathing is non-labored ORA Abd- soft, non-tender, mild distention, umbilical hernia present that is soft and temporarily reduces GU- deferred  MSK- UE/LE symmetrical, no cyanosis, clubbing, or edema. Neuro- CN II-XII grossly in tact, no paresthesias. Psych- Alert and Oriented x3 with appropriate affect Skin: warm and dry, no rashes or lesions   Results for orders placed or performed during the hospital encounter of 09/03/23 (from the past 48 hours)  Lipase, blood     Status: None   Collection Time: 09/03/23  3:47 PM  Result Value Ref Range   Lipase 23 11 - 51 U/L    Comment: Performed at [facility], [address], [place], [place] [address]  Comprehensive metabolic panel     Status: Abnormal   Collection Time: 09/03/23  3:47 PM  Result Value Ref Range   Sodium 136 135 - 145 mmol/L   Potassium 3.9 3.5 - 5.1 mmol/L   Chloride 102 98 - 111 mmol/L   CO2 28 22 - 32 mmol/L   Glucose, Bld 99 70 - 99 mg/dL  Comment: Glucose reference range applies only to samples taken after fasting for at least 8 hours.   BUN 10 6 - 20 mg/dL   Creatinine, Ser 9.60 0.61 - 1.24 mg/dL   Calcium 8.2 (L) 8.9 - 10.3 mg/dL   Total Protein 6.9 6.5 - 8.1 g/dL   Albumin 4.0 3.5 - 5.0 g/dL   AST 15 15 - 41 U/L   ALT 10 0 - 44 U/L   Alkaline Phosphatase 57 38 - 126 U/L   Total Bilirubin 0.4 0.0 - 1.2 mg/dL   GFR, Estimated >45 >40 mL/min    Comment: (NOTE) Calculated using the CKD-EPI Creatinine Equation (2021)     Anion gap 6 5 - 15    Comment: Performed at [facility], [address] [place], [place] [address]  CBC     Status: Abnormal   Collection Time: 09/03/23  3:47 PM  Result Value Ref Range   WBC 11.6 (H) 4.0 - 10.5 K/uL   RBC 5.33 4.22 - 5.81 MIL/uL   Hemoglobin 14.8 13.0 - 17.0 g/dL   HCT 14.7 82.9 - 56.2 %   MCV 86.5 80.0 - 100.0 fL   MCH 27.8 26.0 - 34.0 pg   MCHC 32.1 30.0 - 36.0 g/dL   RDW 13.0 86.5 - 78.4 %   Platelets 393 150 - 400 K/uL   nRBC 0.0 0.0 - 0.2 %    Comment: Performed at [facility], [address], [place], [place] [address]  Urinalysis, Routine w reflex microscopic -Urine, Clean Catch     Status: Abnormal   Collection Time: 09/03/23 10:06 PM  Result Value Ref Range   Color, Urine YELLOW YELLOW   APPearance CLEAR CLEAR   Specific Gravity, Urine >1.046 (H) 1.005 - 1.030   pH 6.0 5.0 - 8.0   Glucose, UA NEGATIVE NEGATIVE mg/dL   Hgb urine dipstick NEGATIVE NEGATIVE   Bilirubin Urine NEGATIVE NEGATIVE   Ketones, ur >80 (A) NEGATIVE mg/dL   Protein, ur TRACE (A) NEGATIVE mg/dL   Nitrite NEGATIVE NEGATIVE   Leukocytes,Ua NEGATIVE NEGATIVE    Comment: Performed at [facility], [address], [place], [place] [address]  Comprehensive metabolic panel     Status: Abnormal   Collection Time: 09/04/23  6:06 AM  Result Value Ref Range   Sodium 138 135 - 145 mmol/L   Potassium 3.8 3.5 - 5.1 mmol/L   Chloride 107 98 - 111 mmol/L   CO2 27 22 - 32 mmol/L   Glucose, Bld 88 70 - 99 mg/dL    Comment: Glucose reference range applies only to samples taken after fasting for at least 8 hours.   BUN 9 6 - 20 mg/dL   Creatinine, Ser 3.24 0.61 - 1.24 mg/dL   Calcium 7.3 (L) 8.9 - 10.3 mg/dL   Total Protein 5.5 (L) 6.5 - 8.1 g/dL   Albumin 2.6 (L) 3.5 - 5.0 g/dL   AST 16 15 - 41 U/L   ALT 11 0 - 44 U/L   Alkaline Phosphatase 51 38 - 126 U/L   Total Bilirubin 0.7 0.0 - 1.2 mg/dL   GFR, Estimated >40 >10 mL/min     Comment: (NOTE) Calculated using the CKD-EPI Creatinine Equation (2021)    Anion gap 4 (L) 5 - 15    Comment: Performed at [facility], [address] [place], [place] [address]  CBC with Differential/Platelet     Status: Abnormal   Collection Time: 09/04/23  6:06 AM  Result Value Ref Range   WBC 10.6 (H) 4.0 - 10.5 K/uL   RBC 4.95 4.22 - 5.81 MIL/uL   Hemoglobin 14.0 13.0 - 17.0 g/dL   HCT 38.7 56.4 - 33.2 %   MCV 87.9 80.0 - 100.0 fL   MCH 28.3 26.0 - 34.0 pg   MCHC 32.2 30.0 - 36.0 g/dL   RDW 95.1 88.4 - 16.6 %   Platelets 325 150 - 400 K/uL   nRBC 0.0 0.0 - 0.2 %   Neutrophils Relative % 67 %   Neutro Abs 7.2 1.7 - 7.7 K/uL   Lymphocytes Relative 17 %   Lymphs Abs 1.8 0.7 - 4.0 K/uL   Monocytes Relative 14 %   Monocytes Absolute 1.5 (H) 0.1 - 1.0 K/uL   Eosinophils Relative 1 %   Eosinophils Absolute 0.1 0.0 - 0.5 K/uL   Basophils Relative 1 %   Basophils Absolute 0.1 0.0 - 0.1 K/uL   Immature Granulocytes 0 %   Abs Immature Granulocytes 0.03 0.00 - 0.07 K/uL    Comment: Performed at [facility], [address], [place], [place] [address]   DG Abd 1 View Result Date: 09/04/2023 CLINICAL DATA:  [phone] with small bowel enteritis and obstruction. EXAM: ABDOMEN - 1 VIEW COMPARISON:  CT contrast yesterday at 8:41 p.m. FINDINGS: Flat plate study in 2 films at 3:46 a.m. There is persistent stable small bowel dilatation up to 3.8 cm. There is scattered gas and stool within the colon through into the rectum. There is contrast in the bladder. There is no supine evidence of free air. There is a 3 mm stone in the upper pole of the right kidney better demonstrated on CT. No other pathologic calcification is seen. The lung bases are clear. IMPRESSION: 1. Persistent stable small bowel dilatation up to 3.8 cm. 2. 3 mm stone in the upper pole of the right kidney. 3. No supine evidence of free air. Electronically Signed   By: Almira Bar M.D.   On: 09/04/2023 04:46   CT  ABDOMEN PELVIS W CONTRAST Result Date: 09/03/2023 CLINICAL DATA:  Abdominal pain EXAM: CT ABDOMEN AND PELVIS WITH CONTRAST TECHNIQUE: Multidetector CT imaging of the abdomen and pelvis was performed using the standard protocol following bolus administration of intravenous contrast. RADIATION DOSE REDUCTION: This exam was performed according to the departmental dose-optimization program which includes automated exposure control, adjustment of the mA and/or kV according to patient size and/or use of iterative reconstruction technique. CONTRAST:  OMNIPAQUE IOHEXOL 300 MG/ML  SOLN COMPARISON:  None Available. FINDINGS: Lower chest: No acute abnormality Hepatobiliary: No focal hepatic abnormality. Gallbladder unremarkable. Pancreas: No focal abnormality or ductal dilatation. Spleen: No focal abnormality.  Normal size. Adrenals/Urinary Tract: No adrenal abnormality. No focal renal abnormality. No stones or hydronephrosis. Urinary bladder is unremarkable. Stomach/Bowel: Sigmoid diverticulosis. Several abnormally thickened distal small bowel loops with surrounding inflammation compatible with pectus or inflammatory enteritis. Small bowel proximal to these distal small bowel loops are mildly dilated compatible with some degree of obstruction. Stomach is decompressed. Vascular/Lymphatic: No evidence of aneurysm or adenopathy. Reproductive: No visible focal abnormality. Other: Trace free fluid in the pelvis.  No free air. Musculoskeletal: No acute bony abnormality. IMPRESSION: Abnormally thickened and inflamed distal small bowel loops compatible with infectious or inflammatory enteritis. Mild associated small bowel obstruction. Electronically Signed   By: Charlett Nose M.D.   On: 09/03/2023 21:52      Assessment/Plan pSBO vs enteritis  Patient history, labs, imaging, and  exam are consistent with infectious vs inflammatory enteritis causing possible mild pSBO. Clinically he is not obstructed. He has had multiple  stools and no nausea/vomiting.  No acute role for surgery. KUB shows small bowel dilation but also air throughout the colon, consistent with pSBO. I have ordered a CLD. If he does well with liquids today, has ongoing bowel function, would recommend advancing as tolerated tomorrow. UDS pending. No GI panel has been ordered but should be considered. After discharge I would recommend outpatient follow up with GI for repeat colonoscopy to evaluate terminal ileitis and r/o inflammatory bowel disease.   CCS will sign off. Call as needed.   I reviewed nursing notes, ED provider notes, hospitalist notes, last 24 h vitals and pain scores, last 48 h intake and output, last 24 h labs and trends, and last 24 h imaging results.  Adam Phenix, [facility] 09/04/2023, 7:47 AM Please see Amion for pager number during day hours 7:00am-4:30pm or 7:00am -11:30am on weekends

## 2023-09-04 NOTE — H&P (Signed)
 History and Physical    Lee Lopez QMV:[id number] DOB: 06-15-1966 DOA: 09/03/2023  Patient coming from: Home.  Chief Complaint: Abdominal pain.  HPI: Lee Lopez is a 58 y.o. male with history of joint pains who had received injection on his both knees about 7 days ago at [facility] presents to the ER with sudden onset of abdominal pain since yesterday morning which woke him up from sleep.  Pain is crampy in nature episodic with multiple episodes of nausea vomiting and diarrhea.  Abdominal pain is mostly in the center of the abdomen periumbilical.  Denies any blood in the vomitus or diarrhea.  Due to persistent symptoms patient presents to the ER.  Denies any recent travel or sick contacts.  Has not had any prior abdominal surgery.  ED Course: In the ER labs show normal LFTs and WBC count 11.6.  CT abdomen pelvis shows abnormally thickened and inflamed distal small bowel loops compatible with infectious or inflammatory enteritis.  Mildly associated small bowel obstruction.  On-call general surgeon was consulted.  Patient is being admitted under hospitalist.  At the time of my exam patient still having some abdominal pain which is mostly located in the center of the abdomen.  Not had any further episodes of diarrhea or vomiting after admission.  Review of Systems: As per HPI, rest all negative.   History reviewed. No pertinent past medical history.  Past Surgical History:  Procedure Laterality Date   arthroscopy       reports that he has never smoked. He has never used smokeless tobacco. He reports current alcohol use of about 6.0 standard drinks of alcohol per week. He reports that he does not currently use drugs.  No Known Allergies  Family History  Problem Relation Age of Onset   Colon cancer Neg Hx     Prior to Admission medications   Medication Sig Start Date End Date Taking? Authorizing Provider  oxyCODONE-acetaminophen (PERCOCET/ROXICET) 5-325 MG tablet Take 1 tablet  by mouth every 6 (six) hours as needed for severe pain. 04/09/22   Blue, Soijett A, PA-C    Physical Exam: Constitutional: Moderately built and nourished. Vitals:   09/03/23 2300 09/03/23 2315 09/04/23 0002 09/04/23 0014  BP: (!) 129/90 (!) 134/97 (!) 132/96   Pulse: 95 96 92   Resp:  20 18   Temp:  98 F (36.7 C) 98.9 F (37.2 C)   TempSrc:   Oral   SpO2: 93% 93% 97%   Weight:    83.9 kg  Height:    5\' 11"  (1.803 m)   Eyes: Anicteric no pallor. ENMT: No discharge from the ears eyes nose or mouth. Neck: No mass felt.  No neck rigidity. Respiratory: No rhonchi or crepitations. Cardiovascular: S1-S2 heard. Abdomen: Soft nontender bowel sounds present.  No guarding or rigidity. Musculoskeletal: No edema. Skin: No rash. Neurologic: Alert awake oriented to time place and person.  Moves all extremities. Psychiatric: Appears normal.  Normal affect.   Labs on Admission: I have personally reviewed following labs and imaging studies  CBC: Recent Labs  Lab 09/03/23 1547  WBC 11.6*  HGB 14.8  HCT 46.1  MCV 86.5  PLT 393   Basic Metabolic Panel: Recent Labs  Lab 09/03/23 1547  NA 136  K 3.9  CL 102  CO2 28  GLUCOSE 99  BUN 10  CREATININE 0.98  CALCIUM 8.2*   GFR: Estimated Creatinine Clearance: 90.7 mL/min (by C-G formula based on SCr of 0.98 mg/dL). Liver Function  Tests: Recent Labs  Lab 09/03/23 1547  AST 15  ALT 10  ALKPHOS 57  BILITOT 0.4  PROT 6.9  ALBUMIN 4.0   Recent Labs  Lab 09/03/23 1547  LIPASE 23   No results for input(s): "AMMONIA" in the last 168 hours. Coagulation Profile: No results for input(s): "INR", "PROTIME" in the last 168 hours. Cardiac Enzymes: No results for input(s): "CKTOTAL", "CKMB", "CKMBINDEX", "TROPONINI" in the last 168 hours. BNP (last 3 results) No results for input(s): "PROBNP" in the last 8760 hours. HbA1C: No results for input(s): "HGBA1C" in the last 72 hours. CBG: No results for input(s): "GLUCAP" in the last  168 hours. Lipid Profile: No results for input(s): "CHOL", "HDL", "LDLCALC", "TRIG", "CHOLHDL", "LDLDIRECT" in the last 72 hours. Thyroid Function Tests: No results for input(s): "TSH", "T4TOTAL", "FREET4", "T3FREE", "THYROIDAB" in the last 72 hours. Anemia Panel: No results for input(s): "VITAMINB12", "FOLATE", "FERRITIN", "TIBC", "IRON", "RETICCTPCT" in the last 72 hours. Urine analysis:    Component Value Date/Time   COLORURINE YELLOW 09/03/2023 2206   APPEARANCEUR CLEAR 09/03/2023 2206   LABSPEC >1.046 (H) 09/03/2023 2206   PHURINE 6.0 09/03/2023 2206   GLUCOSEU NEGATIVE 09/03/2023 2206   HGBUR NEGATIVE 09/03/2023 2206   BILIRUBINUR NEGATIVE 09/03/2023 2206   KETONESUR >80 (A) 09/03/2023 2206   PROTEINUR TRACE (A) 09/03/2023 2206   UROBILINOGEN 0.2 03/14/2007 0414   NITRITE NEGATIVE 09/03/2023 2206   LEUKOCYTESUR NEGATIVE 09/03/2023 2206   Sepsis Labs: @LABRCNTIP (procalcitonin:4,lacticidven:4) )No results found for this or any previous visit (from the past 240 hours).   Radiological Exams on Admission: CT ABDOMEN PELVIS W CONTRAST Result Date: 09/03/2023 CLINICAL DATA:  Abdominal pain EXAM: CT ABDOMEN AND PELVIS WITH CONTRAST TECHNIQUE: Multidetector CT imaging of the abdomen and pelvis was performed using the standard protocol following bolus administration of intravenous contrast. RADIATION DOSE REDUCTION: This exam was performed according to the departmental dose-optimization program which includes automated exposure control, adjustment of the mA and/or kV according to patient size and/or use of iterative reconstruction technique. CONTRAST:  OMNIPAQUE IOHEXOL 300 MG/ML  SOLN COMPARISON:  None Available. FINDINGS: Lower chest: No acute abnormality Hepatobiliary: No focal hepatic abnormality. Gallbladder unremarkable. Pancreas: No focal abnormality or ductal dilatation. Spleen: No focal abnormality.  Normal size. Adrenals/Urinary Tract: No adrenal abnormality. No focal renal  abnormality. No stones or hydronephrosis. Urinary bladder is unremarkable. Stomach/Bowel: Sigmoid diverticulosis. Several abnormally thickened distal small bowel loops with surrounding inflammation compatible with pectus or inflammatory enteritis. Small bowel proximal to these distal small bowel loops are mildly dilated compatible with some degree of obstruction. Stomach is decompressed. Vascular/Lymphatic: No evidence of aneurysm or adenopathy. Reproductive: No visible focal abnormality. Other: Trace free fluid in the pelvis.  No free air. Musculoskeletal: No acute bony abnormality. IMPRESSION: Abnormally thickened and inflamed distal small bowel loops compatible with infectious or inflammatory enteritis. Mild associated small bowel obstruction. Electronically Signed   By: Charlett Nose M.D.   On: 09/03/2023 21:52     Assessment/Plan Principal Problem:   Abdominal pain Active Problems:   Enteritis   Alcohol use   Joint pain    Abdominal pain with CT scan showing thickened and inflamed distal small bowel loops concerning for infectious versus inflammatory enteritis.  It is also associated with small bowel obstruction.  General surgery has been consulted.  Will keep patient n.p.o. and IV fluids pain relief medication and get KUB.  Awaiting further recommendations from general surgery. History of alcohol use on CIWA protocol. Bilateral knee pain status  post recent injection from [facility].  Since patient has abdominal pain with concern for bowel obstruction will need close monitoring and more than 2 midnight stay.   DVT prophylaxis: SCDs.  If no procedures planned may start on Lovenox. Code Status: Full code. Family Communication: Discussed with patient. Disposition Plan: Medical floor. Consults called: Surgery. Admission status: Inpatient.

## 2023-09-04 NOTE — Progress Notes (Signed)
 PROGRESS NOTE  Lee Lopez ZHG:[id number] DOB: 08-Jan-1966   PCP: Eartha Inch, MD  Patient is from: Home.  DOA: 09/03/2023 LOS: 1  Chief complaints Chief Complaint  Patient presents with   Abdominal Pain     Brief Narrative / Interim history: 58 year old M with PMH of chronic diarrhea, alcohol abuse, tobacco use, hypogonadism, opiate disorder and osteoarthritis s/p QC kinetics injections to both knees about 7 days ago presenting with periumbilical abdominal pain with multiple episodes of nonbilious and nonbloody emesis, and admitted with working diagnosis of partial SBO and possible ileitis.  He had mild leukocytosis.  CT abdomen and pelvis raise concern for abnormality thickened and inflamed distal small bowel loops compatible with infectious or inflammatory enteritis and mildly associated small bowel obstruction.  General surgery consulted.   Subjective: Seen and examined earlier this morning.  No major events overnight of this morning.  Continues to endorse abdominal pain that he describes as stabbing and cramping.  Pain is intermittent.  Very severe when it comes but it lasts 10 seconds before it is soft.  Last emesis and bowel movement yesterday before lunch.  Not passing gas.  Objective: Vitals:   09/04/23 0014 09/04/23 0402 09/04/23 0801 09/04/23 1146  BP:  126/89 136/85 134/85  Pulse:  80 (!) 109 91  Resp:  20 16 16   Temp:  98.7 F (37.1 C) 99.2 F (37.3 C) 98.6 F (37 C)  TempSrc:  Oral Oral Oral  SpO2:  94% 93% 96%  Weight: 83.9 kg     Height: 5\' 11"  (1.803 m)       Examination:  GENERAL: No apparent distress.  Nontoxic. HEENT: MMM.  Vision and hearing grossly intact.  NECK: Supple.  No apparent JVD.  RESP:  No IWOB.  Fair aeration bilaterally. CVS:  RRR. Heart sounds normal.  ABD/GI/GU: BS+. Abd soft.  Diffusely tender.  Umbilical hernia.  No rebound or guarding. MSK/EXT:  Moves extremities. No apparent deformity. No edema.  SKIN: no apparent skin  lesion or wound NEURO: Awake, alert and oriented appropriately.  No apparent focal neuro deficit. PSYCH: Calm. Normal affect.   Consultants:  General surgery  Procedures: None  Microbiology summarized: None  Assessment and plan: Nausea, vomiting and abdominal pain: CT showed thickened and inflamed distal small bowel loops concerning for infectious versus inflammatory enteritis with associated possible bowel obstruction.  KUB this morning with persistent stable small bowel dilation up to 3.8 cm.  Last emesis and bowel movement on 3/18 -Appreciate recommendation by surgery-CLD -Pain control, PPI and antiemetics.  Counseled and warned on the adverse effect of opiate -Mobilize patient -Check UDS, CRP, ESR and fecal calprotectin -Needs outpatient follow-up with GI  Alcohol use disorder: Reports smoking about 6 beers a day.  Last drink 2 days ago.  Denies withdrawal symptoms. -Monitor for withdrawal symptoms -Encouraged moderation/cessation. -Continue multivitamin, thiamine and folic acid  Bilateral knee osteoarthritis s/p QC kinetics injection to both knees about a week ago. -Pain control  Hypogonadism: On testosterone supplement outpatient.  GERD -Continue PPI  Body mass index is 25.8 kg/m.           DVT prophylaxis:  SCDs Start: 09/04/23 0327  Code Status: Full code Family Communication: None at bedside Level of care: Med-Surg Status is: Inpatient Remains inpatient appropriate because: Abdominal pain   Final disposition: Likely home once medically cleared   55 minutes with more than 50% spent in reviewing records, counseling patient/family and coordinating care.   Sch Meds:  Scheduled  Meds:  folic acid  1 mg Oral Daily   multivitamin with minerals  1 tablet Oral Daily   pantoprazole  40 mg Oral Daily   thiamine  100 mg Oral Daily   Continuous Infusions:  lactated ringers 100 mL/hr at 09/04/23 0359   PRN Meds:.HYDROmorphone (DILAUDID) injection,  LORazepam **OR** LORazepam, ondansetron **OR** ondansetron (ZOFRAN) IV, oxyCODONE  Antimicrobials: Anti-infectives (From admission, onward)    None        I have personally reviewed the following labs and images: CBC: Recent Labs  Lab 09/03/23 1547 09/04/23 0606  WBC 11.6* 10.6*  NEUTROABS  --  7.2  HGB 14.8 14.0  HCT 46.1 43.5  MCV 86.5 87.9  PLT 393 325   BMP &GFR Recent Labs  Lab 09/03/23 1547 09/04/23 0606  NA 136 138  K 3.9 3.8  CL 102 107  CO2 28 27  GLUCOSE 99 88  BUN 10 9  CREATININE 0.98 1.00  CALCIUM 8.2* 7.3*   Estimated Creatinine Clearance: 88.9 mL/min (by C-G formula based on SCr of 1 mg/dL). Liver & Pancreas: Recent Labs  Lab 09/03/23 1547 09/04/23 0606  AST 15 16  ALT 10 11  ALKPHOS 57 51  BILITOT 0.4 0.7  PROT 6.9 5.5*  ALBUMIN 4.0 2.6*   Recent Labs  Lab 09/03/23 1547  LIPASE 23   No results for input(s): "AMMONIA" in the last 168 hours. Diabetic: No results for input(s): "HGBA1C" in the last 72 hours. No results for input(s): "GLUCAP" in the last 168 hours. Cardiac Enzymes: No results for input(s): "CKTOTAL", "CKMB", "CKMBINDEX", "TROPONINI" in the last 168 hours. No results for input(s): "PROBNP" in the last 8760 hours. Coagulation Profile: No results for input(s): "INR", "PROTIME" in the last 168 hours. Thyroid Function Tests: No results for input(s): "TSH", "T4TOTAL", "FREET4", "T3FREE", "THYROIDAB" in the last 72 hours. Lipid Profile: No results for input(s): "CHOL", "HDL", "LDLCALC", "TRIG", "CHOLHDL", "LDLDIRECT" in the last 72 hours. Anemia Panel: No results for input(s): "VITAMINB12", "FOLATE", "FERRITIN", "TIBC", "IRON", "RETICCTPCT" in the last 72 hours. Urine analysis:    Component Value Date/Time   COLORURINE YELLOW 09/03/2023 2206   APPEARANCEUR CLEAR 09/03/2023 2206   LABSPEC >1.046 (H) 09/03/2023 2206   PHURINE 6.0 09/03/2023 2206   GLUCOSEU NEGATIVE 09/03/2023 2206   HGBUR NEGATIVE 09/03/2023 2206    BILIRUBINUR NEGATIVE 09/03/2023 2206   KETONESUR >80 (A) 09/03/2023 2206   PROTEINUR TRACE (A) 09/03/2023 2206   UROBILINOGEN 0.2 03/14/2007 0414   NITRITE NEGATIVE 09/03/2023 2206   LEUKOCYTESUR NEGATIVE 09/03/2023 2206   Sepsis Labs: Invalid input(s): "PROCALCITONIN", "LACTICIDVEN"  Microbiology: No results found for this or any previous visit (from the past 240 hours).  Radiology Studies: DG Abd 1 View Result Date: 09/04/2023 CLINICAL DATA:  [phone] with small bowel enteritis and obstruction. EXAM: ABDOMEN - 1 VIEW COMPARISON:  CT contrast yesterday at 8:41 p.m. FINDINGS: Flat plate study in 2 films at 3:46 a.m. There is persistent stable small bowel dilatation up to 3.8 cm. There is scattered gas and stool within the colon through into the rectum. There is contrast in the bladder. There is no supine evidence of free air. There is a 3 mm stone in the upper pole of the right kidney better demonstrated on CT. No other pathologic calcification is seen. The lung bases are clear. IMPRESSION: 1. Persistent stable small bowel dilatation up to 3.8 cm. 2. 3 mm stone in the upper pole of the right kidney. 3. No supine evidence of free air.  Electronically Signed   By: Almira Bar M.D.   On: 09/04/2023 04:46   CT ABDOMEN PELVIS W CONTRAST Result Date: 09/03/2023 CLINICAL DATA:  Abdominal pain EXAM: CT ABDOMEN AND PELVIS WITH CONTRAST TECHNIQUE: Multidetector CT imaging of the abdomen and pelvis was performed using the standard protocol following bolus administration of intravenous contrast. RADIATION DOSE REDUCTION: This exam was performed according to the departmental dose-optimization program which includes automated exposure control, adjustment of the mA and/or kV according to patient size and/or use of iterative reconstruction technique. CONTRAST:  OMNIPAQUE IOHEXOL 300 MG/ML  SOLN COMPARISON:  None Available. FINDINGS: Lower chest: No acute abnormality Hepatobiliary: No focal hepatic  abnormality. Gallbladder unremarkable. Pancreas: No focal abnormality or ductal dilatation. Spleen: No focal abnormality.  Normal size. Adrenals/Urinary Tract: No adrenal abnormality. No focal renal abnormality. No stones or hydronephrosis. Urinary bladder is unremarkable. Stomach/Bowel: Sigmoid diverticulosis. Several abnormally thickened distal small bowel loops with surrounding inflammation compatible with pectus or inflammatory enteritis. Small bowel proximal to these distal small bowel loops are mildly dilated compatible with some degree of obstruction. Stomach is decompressed. Vascular/Lymphatic: No evidence of aneurysm or adenopathy. Reproductive: No visible focal abnormality. Other: Trace free fluid in the pelvis.  No free air. Musculoskeletal: No acute bony abnormality. IMPRESSION: Abnormally thickened and inflamed distal small bowel loops compatible with infectious or inflammatory enteritis. Mild associated small bowel obstruction. Electronically Signed   By: Charlett Nose M.D.   On: 09/03/2023 21:52      Josephine Wooldridge T. Kamonte Mcmichen [facility]  If 7PM-7AM, please contact night-coverage [email or web address] 09/04/2023, 1:49 PM

## 2023-09-05 DIAGNOSIS — R109 Unspecified abdominal pain: Secondary | ICD-10-CM | POA: Diagnosis not present

## 2023-09-05 DIAGNOSIS — M25561 Pain in right knee: Secondary | ICD-10-CM | POA: Diagnosis not present

## 2023-09-05 DIAGNOSIS — Z789 Other specified health status: Secondary | ICD-10-CM | POA: Diagnosis not present

## 2023-09-05 DIAGNOSIS — K529 Noninfective gastroenteritis and colitis, unspecified: Secondary | ICD-10-CM | POA: Diagnosis not present

## 2023-09-05 DIAGNOSIS — F1490 Cocaine use, unspecified, uncomplicated: Secondary | ICD-10-CM | POA: Insufficient documentation

## 2023-09-05 LAB — MAGNESIUM: Magnesium: 2.1 mg/dL (ref 1.7–2.4)

## 2023-09-05 LAB — COMPREHENSIVE METABOLIC PANEL
ALT: 16 U/L (ref 0–44)
AST: 28 U/L (ref 15–41)
Albumin: 2.4 g/dL — ABNORMAL LOW (ref 3.5–5.0)
Alkaline Phosphatase: 52 U/L (ref 38–126)
Anion gap: 6 (ref 5–15)
BUN: 7 mg/dL (ref 6–20)
CO2: 26 mmol/L (ref 22–32)
Calcium: 7.8 mg/dL — ABNORMAL LOW (ref 8.9–10.3)
Chloride: 106 mmol/L (ref 98–111)
Creatinine, Ser: 1.06 mg/dL (ref 0.61–1.24)
GFR, Estimated: >60 mL/min (ref >60)
Glucose, Bld: 101 mg/dL — ABNORMAL HIGH (ref 70–99)
Potassium: 3.6 mmol/L (ref 3.5–5.1)
Sodium: 138 mmol/L (ref 135–145)
Total Bilirubin: 0.3 mg/dL (ref 0.0–1.2)
Total Protein: 5 g/dL — ABNORMAL LOW (ref 6.5–8.1)

## 2023-09-05 LAB — CBC
HCT: 40.2 % (ref 39.0–52.0)
Hemoglobin: 12.6 g/dL — ABNORMAL LOW (ref 13.0–17.0)
MCH: 27.6 pg (ref 26.0–34.0)
MCHC: 31.3 g/dL (ref 30.0–36.0)
MCV: 88.2 fL (ref 80.0–100.0)
Platelets: 302 10*3/uL (ref 150–400)
RBC: 4.56 MIL/uL (ref 4.22–5.81)
RDW: 13.7 % (ref 11.5–15.5)
WBC: 6.1 10*3/uL (ref 4.0–10.5)
nRBC: 0 % (ref 0.0–0.2)

## 2023-09-05 LAB — PHOSPHORUS: Phosphorus: 2.6 mg/dL (ref 2.5–4.6)

## 2023-09-05 MED ORDER — AMOXICILLIN-POT CLAVULANATE 875-125 MG PO TABS
1.0000 | ORAL_TABLET | Freq: Two times a day (BID) | ORAL | Status: DC
Start: 2023-09-05 — End: 2023-09-05
  Administered 2023-09-05: 1 via ORAL
  Filled 2023-09-05: qty 1

## 2023-09-05 MED ORDER — AMOXICILLIN-POT CLAVULANATE 875-125 MG PO TABS: 1.0000 | ORAL_TABLET | Freq: Two times a day (BID) | ORAL | 0 refills | Status: AC

## 2023-09-05 MED ORDER — FOLIC ACID 1 MG PO TABS: 1.0000 mg | ORAL_TABLET | Freq: Every day | ORAL | 0 refills | Status: AC

## 2023-09-05 MED ORDER — ADULT MULTIVITAMIN W/MINERALS CH: 1.0000 | ORAL_TABLET | Freq: Every day | ORAL | Status: AC

## 2023-09-05 MED ORDER — VITAMIN B-1 100 MG PO TABS: 100.0000 mg | ORAL_TABLET | Freq: Every day | ORAL | 0 refills | Status: AC

## 2023-09-05 NOTE — Discharge Summary (Signed)
 Physician Discharge Summary  Lee Lopez OZH:[id number] DOB: 1965/09/20 DOA: 09/03/2023  PCP: Eartha Inch, MD  Admit date: 09/03/2023 Discharge date: 09/05/23  Admitted From: Home Disposition: Home Recommendations for Outpatient Follow-up:  Follow up with PCP in 1 to 2 weeks Recommend outpatient follow-up with GI for evaluation of ileitis/colonoscopy Check CBC and CMP in 1 week Please follow up on the following pending results: None  Home Health: No need identified Equipment/Devices: No need identified  Discharge Condition: Stable CODE STATUS: Full code  Follow-up Information     Eartha Inch, MD. Schedule an appointment as soon as possible for a visit in 1 week(s).   Specialty: Family Medicine Contact information: [address] [place] [address] [phone]                 Hospital course 58 year old M with PMH of chronic diarrhea, alcohol abuse, tobacco use, hypogonadism, opiate disorder and osteoarthritis s/p QC kinetics injections to both knees about 7 days ago presenting with periumbilical abdominal pain with multiple episodes of nonbilious and nonbloody emesis, and admitted with working diagnosis of partial SBO and possible ileitis.   He had mild leukocytosis.  CT abdomen and pelvis raise concern for abnormality thickened and inflamed distal small bowel loops compatible with infectious or inflammatory enteritis and mildly associated small bowel obstruction.  General surgery consulted  The next day, KUB showed small bowel dilation but also air throughout the colon.  General surgeon evaluated patient, started diet and signed off.  UDS positive for cocaine and benzo.  He has Valium on his med list.  CRP elevated to 10.8 but ESR only 20.  On the day of discharge, nausea and vomiting resolved.  Abdominal pain improved significantly.  Reported normal bowel movements.  Tolerated soft diet.  Feels well and ready to go home.  He is  discharged on p.o. Augmentin for 5 days for possible infectious ileitis although CT finding is likely from ischemic colitis.  His CRP was also elevated to 10.8 but ESR is only 20.  He has been encouraged to refrain from cocaine use.  Also counseled on the importance of cessation and moderation of alcohol.  Encouraged to follow-up with GI for colonoscopy and further evaluation.   See individual problem list below for more.   Problems addressed during this hospitalization Nausea, vomiting and abdominal pain: CT showed thickened and inflamed distal small bowel loops concerning for ileitis most likely ischemic from cocaine use.  Cannot rule out infectious or inflammatory ileitis.  Patient's symptoms improved significantly.  No further nausea or vomiting.  Tolerated soft diet and had regular bowel movements. -Discharge on p.o. Augmentin for 5 days for possible infectious ileitis.  -Counseled on cocaine and alcohol cessation -Outpatient follow-up with GI    Alcohol use disorder: Reports smoking about 6 beers a day.  Last drink 2 days ago.  Denies withdrawal symptoms. -Encourage moderation/cessation -Continue thiamine, folic acid and multivitamin  Cocaine use: UDS positive for cocaine.  Admitted when fronted with result -Advised to refrain from cocaine use -Provided with resources.   Bilateral knee osteoarthritis s/p QC kinetics injection to both knees about a week ago. -Outpatient follow-up.   Hypogonadism: On testosterone supplement outpatient.   GERD -Continue PPI            Time spent 35 minutes  Vital signs Vitals:   09/04/23 1619 09/04/23 2024 09/05/23 0548 09/05/23 0722  BP:  (!) 139/95 113/75 117/68  Pulse: 84 90 67  73  Temp: 98.4 F (36.9 C) (!) 100.8 F (38.2 C) 98.6 F (37 C) 98 F (36.7 C)  Resp:  19 18 18   Height:      Weight:      SpO2: 94% 98% 99% 96%  TempSrc: Oral Oral Oral   BMI (Calculated):         Discharge exam  GENERAL: No apparent distress.   Nontoxic. HEENT: MMM.  Vision and hearing grossly intact.  NECK: Supple.  No apparent JVD.  RESP:  No IWOB.  Fair aeration bilaterally. CVS:  RRR. Heart sounds normal.  ABD/GI/GU: BS+. Abd soft, NTND.  MSK/EXT:  Moves extremities. No apparent deformity. No edema.  SKIN: no apparent skin lesion or wound NEURO: Awake and alert. Oriented appropriately.  No apparent focal neuro deficit. PSYCH: Calm. Normal affect.   Discharge Instructions Discharge Instructions     Diet general   Complete by: As directed    Discharge instructions   Complete by: As directed    It has been a pleasure taking care of you!  You were hospitalized due to abdominal pain likely from cocaine use.  We strongly recommend you refrain from using cocaine.  We also recommend moderation with alcohol (not more than 2 beers in any given day).  We have started you on antibiotics for possible infection although this is less likely.  We strongly recommend you follow-up with your gastroenterologist for colonoscopy in the near future.  Follow-up with your primary care doctor in 1 to 2 weeks or sooner if needed.   Take care,   Increase activity slowly   Complete by: As directed       Allergies as of 09/05/2023   No Known Allergies      Medication List     TAKE these medications    amoxicillin-clavulanate 875-125 MG tablet Commonly known as: AUGMENTIN Take 1 tablet by mouth every 12 (twelve) hours for 5 days.   anastrozole 1 MG tablet Commonly known as: ARIMIDEX Take 0.5 mg by mouth daily.   diazepam 10 MG tablet Commonly known as: VALIUM Take 20 mg by mouth daily as needed for anxiety.   folic acid 1 MG tablet Commonly known as: FOLVITE Take 1 tablet (1 mg total) by mouth daily.   multivitamin with minerals Tabs tablet Take 1 tablet by mouth daily.   omeprazole 40 MG capsule Commonly known as: PRILOSEC Take 40 mg by mouth daily.   testosterone cypionate 200 MG/ML injection Commonly known as:  DEPOTESTOSTERONE CYPIONATE Inject 200 mg into the muscle once a week.   thiamine 100 MG tablet Commonly known as: Vitamin B-1 Take 1 tablet (100 mg total) by mouth daily.        Consultations: General surgery  Procedures/Studies:   DG Abd 1 View Result Date: 09/04/2023 CLINICAL DATA:  [id number] with small bowel enteritis and obstruction. EXAM: ABDOMEN - 1 VIEW COMPARISON:  CT contrast yesterday at 8:41 p.m. FINDINGS: Flat plate study in 2 films at 3:46 a.m. There is persistent stable small bowel dilatation up to 3.8 cm. There is scattered gas and stool within the colon through into the rectum. There is contrast in the bladder. There is no supine evidence of free air. There is a 3 mm stone in the upper pole of the right kidney better demonstrated on CT. No other pathologic calcification is seen. The lung bases are clear. IMPRESSION: 1. Persistent stable small bowel dilatation up to 3.8 cm. 2. 3 mm stone in the upper pole of  the right kidney. 3. No supine evidence of free air. Electronically Signed   By: Almira Bar M.D.   On: 09/04/2023 04:46   CT ABDOMEN PELVIS W CONTRAST Result Date: 09/03/2023 CLINICAL DATA:  Abdominal pain EXAM: CT ABDOMEN AND PELVIS WITH CONTRAST TECHNIQUE: Multidetector CT imaging of the abdomen and pelvis was performed using the standard protocol following bolus administration of intravenous contrast. RADIATION DOSE REDUCTION: This exam was performed according to the departmental dose-optimization program which includes automated exposure control, adjustment of the mA and/or kV according to patient size and/or use of iterative reconstruction technique. CONTRAST:  OMNIPAQUE IOHEXOL 300 MG/ML  SOLN COMPARISON:  None Available. FINDINGS: Lower chest: No acute abnormality Hepatobiliary: No focal hepatic abnormality. Gallbladder unremarkable. Pancreas: No focal abnormality or ductal dilatation. Spleen: No focal abnormality.  Normal size. Adrenals/Urinary Tract: No adrenal  abnormality. No focal renal abnormality. No stones or hydronephrosis. Urinary bladder is unremarkable. Stomach/Bowel: Sigmoid diverticulosis. Several abnormally thickened distal small bowel loops with surrounding inflammation compatible with pectus or inflammatory enteritis. Small bowel proximal to these distal small bowel loops are mildly dilated compatible with some degree of obstruction. Stomach is decompressed. Vascular/Lymphatic: No evidence of aneurysm or adenopathy. Reproductive: No visible focal abnormality. Other: Trace free fluid in the pelvis.  No free air. Musculoskeletal: No acute bony abnormality. IMPRESSION: Abnormally thickened and inflamed distal small bowel loops compatible with infectious or inflammatory enteritis. Mild associated small bowel obstruction. Electronically Signed   By: Charlett Nose M.D.   On: 09/03/2023 21:52       The results of significant diagnostics from this hospitalization (including imaging, microbiology, ancillary and laboratory) are listed below for reference.     Microbiology: No results found for this or any previous visit (from the past 240 hours).   Labs:  CBC: Recent Labs  Lab 09/03/23 1547 09/04/23 0606 09/05/23 0726  WBC 11.6* 10.6* 6.1  NEUTROABS  --  7.2  --   HGB 14.8 14.0 12.6*  HCT 46.1 43.5 40.2  MCV 86.5 87.9 88.2  PLT 393 325 302   BMP &GFR Recent Labs  Lab 09/03/23 1547 09/04/23 0606 09/05/23 0726  NA 136 138 138  K 3.9 3.8 3.6  CL 102 107 106  CO2 28 27 26   GLUCOSE 99 88 101*  BUN 10 9 7   CREATININE 0.98 1.00 1.06  CALCIUM 8.2* 7.3* 7.8*  MG  --   --  2.1  PHOS  --   --  2.6   Estimated Creatinine Clearance: 83.9 mL/min (by C-G formula based on SCr of 1.06 mg/dL). Liver & Pancreas: Recent Labs  Lab 09/03/23 1547 09/04/23 0606 09/05/23 0726  AST 15 16 28   ALT 10 11 16   ALKPHOS 57 51 52  BILITOT 0.4 0.7 0.3  PROT 6.9 5.5* 5.0*  ALBUMIN 4.0 2.6* 2.4*   Recent Labs  Lab 09/03/23 1547  LIPASE 23   No  results for input(s): "AMMONIA" in the last 168 hours. Diabetic: No results for input(s): "HGBA1C" in the last 72 hours. No results for input(s): "GLUCAP" in the last 168 hours. Cardiac Enzymes: No results for input(s): "CKTOTAL", "CKMB", "CKMBINDEX", "TROPONINI" in the last 168 hours. No results for input(s): "PROBNP" in the last 8760 hours. Coagulation Profile: No results for input(s): "INR", "PROTIME" in the last 168 hours. Thyroid Function Tests: No results for input(s): "TSH", "T4TOTAL", "FREET4", "T3FREE", "THYROIDAB" in the last 72 hours. Lipid Profile: No results for input(s): "CHOL", "HDL", "LDLCALC", "TRIG", "CHOLHDL", "LDLDIRECT" in the last  72 hours. Anemia Panel: No results for input(s): "VITAMINB12", "FOLATE", "FERRITIN", "TIBC", "IRON", "RETICCTPCT" in the last 72 hours. Urine analysis:    Component Value Date/Time   COLORURINE YELLOW 09/03/2023 2206   APPEARANCEUR CLEAR 09/03/2023 2206   LABSPEC >1.046 (H) 09/03/2023 2206   PHURINE 6.0 09/03/2023 2206   GLUCOSEU NEGATIVE 09/03/2023 2206   HGBUR NEGATIVE 09/03/2023 2206   BILIRUBINUR NEGATIVE 09/03/2023 2206   KETONESUR >80 (A) 09/03/2023 2206   PROTEINUR TRACE (A) 09/03/2023 2206   UROBILINOGEN 0.2 03/14/2007 0414   NITRITE NEGATIVE 09/03/2023 2206   LEUKOCYTESUR NEGATIVE 09/03/2023 2206   Sepsis Labs: Invalid input(s): "PROCALCITONIN", "LACTICIDVEN"   SIGNED:  Almon Hercules, MD  [facility] 09/05/2023, 2:17 PM

## 2023-09-05 NOTE — Plan of Care (Signed)
  Problem: Health Behavior/Discharge Planning: Goal: Ability to manage health-related needs will improve Outcome: Progressing   Problem: Coping: Goal: Level of anxiety will decrease Outcome: Progressing   Problem: Pain Managment: Goal: General experience of comfort will improve and/or be controlled Outcome: Progressing

## 2023-09-05 NOTE — Plan of Care (Signed)
  Problem: Education: Goal: Knowledge of General Education information will improve Description: Including pain rating scale, medication(s)/side effects and non-pharmacologic comfort measures 09/05/2023 1016 by Amado Coe, RN Outcome: Adequate for Discharge 09/05/2023 1016 by Amado Coe, RN Outcome: Progressing   Problem: Health Behavior/Discharge Planning: Goal: Ability to manage health-related needs will improve 09/05/2023 1016 by Amado Coe, RN Outcome: Adequate for Discharge 09/05/2023 1016 by Amado Coe, RN Outcome: Progressing   Problem: Clinical Measurements: Goal: Ability to maintain clinical measurements within normal limits will improve 09/05/2023 1016 by Amado Coe, RN Outcome: Adequate for Discharge 09/05/2023 1016 by Amado Coe, RN Outcome: Progressing Goal: Will remain free from infection 09/05/2023 1016 by Amado Coe, RN Outcome: Adequate for Discharge 09/05/2023 1016 by Amado Coe, RN Outcome: Progressing Goal: Diagnostic test results will improve 09/05/2023 1016 by Amado Coe, RN Outcome: Adequate for Discharge 09/05/2023 1016 by Amado Coe, RN Outcome: Progressing Goal: Respiratory complications will improve 09/05/2023 1016 by Amado Coe, RN Outcome: Adequate for Discharge 09/05/2023 1016 by Amado Coe, RN Outcome: Progressing Goal: Cardiovascular complication will be avoided 09/05/2023 1016 by Amado Coe, RN Outcome: Adequate for Discharge 09/05/2023 1016 by Amado Coe, RN Outcome: Progressing   Problem: Activity: Goal: Risk for activity intolerance will decrease 09/05/2023 1016 by Amado Coe, RN Outcome: Adequate for Discharge 09/05/2023 1016 by Amado Coe, RN Outcome: Progressing   Problem: Nutrition: Goal: Adequate nutrition will be maintained 09/05/2023 1016 by Amado Coe, RN Outcome: Adequate for Discharge 09/05/2023 1016 by Amado Coe, RN Outcome: Progressing    Problem: Coping: Goal: Level of anxiety will decrease 09/05/2023 1016 by Amado Coe, RN Outcome: Adequate for Discharge 09/05/2023 1016 by Amado Coe, RN Outcome: Progressing   Problem: Elimination: Goal: Will not experience complications related to bowel motility 09/05/2023 1016 by Amado Coe, RN Outcome: Adequate for Discharge 09/05/2023 1016 by Amado Coe, RN Outcome: Progressing Goal: Will not experience complications related to urinary retention 09/05/2023 1016 by Amado Coe, RN Outcome: Adequate for Discharge 09/05/2023 1016 by Amado Coe, RN Outcome: Progressing   Problem: Pain Managment: Goal: General experience of comfort will improve and/or be controlled 09/05/2023 1016 by Amado Coe, RN Outcome: Adequate for Discharge 09/05/2023 1016 by Amado Coe, RN Outcome: Progressing   Problem: Safety: Goal: Ability to remain free from injury will improve 09/05/2023 1016 by Amado Coe, RN Outcome: Adequate for Discharge 09/05/2023 1016 by Amado Coe, RN Outcome: Progressing   Problem: Skin Integrity: Goal: Risk for impaired skin integrity will decrease 09/05/2023 1016 by Amado Coe, RN Outcome: Adequate for Discharge 09/05/2023 1016 by Amado Coe, RN Outcome: Progressing
